# Patient Record
Sex: Male | Born: 1990
Health system: Southern US, Community
[De-identification: ages and names within clinical notes are randomized; demographics above are authoritative.]

## PROBLEM LIST (undated history)

## (undated) DIAGNOSIS — B009 Herpesviral infection, unspecified: Secondary | ICD-10-CM

## (undated) HISTORY — DX: Herpesviral infection, unspecified: B00.9

## (undated) HISTORY — PX: OTHER SURGICAL HISTORY: SHX169

---

## 2010-10-15 ENCOUNTER — Emergency Department: Payer: Self-pay | Admitting: Emergency Medicine

## 2015-03-11 ENCOUNTER — Ambulatory Visit (INDEPENDENT_AMBULATORY_CARE_PROVIDER_SITE_OTHER): Payer: Managed Care, Other (non HMO) | Admitting: Family Medicine

## 2015-03-11 ENCOUNTER — Encounter: Payer: Self-pay | Admitting: Family Medicine

## 2015-03-11 VITALS — BP 93/52 | HR 54 | Temp 98.6°F | Ht 66.3 in | Wt 168.0 lb

## 2015-03-11 DIAGNOSIS — Z23 Encounter for immunization: Secondary | ICD-10-CM

## 2015-03-11 DIAGNOSIS — Z0000 Encounter for general adult medical examination without abnormal findings: Secondary | ICD-10-CM | POA: Diagnosis not present

## 2015-03-11 LAB — URINALYSIS, ROUTINE W REFLEX MICROSCOPIC
Bilirubin, UA: NEGATIVE
Glucose, UA: NEGATIVE
KETONES UA: NEGATIVE
LEUKOCYTES UA: NEGATIVE
NITRITE UA: NEGATIVE
PH UA: 6.5 (ref 5.0–7.5)
PROTEIN UA: NEGATIVE
RBC, UA: NEGATIVE
Specific Gravity, UA: 1.015 (ref 1.005–1.030)
Urobilinogen, Ur: 0.2 mg/dL (ref 0.2–1.0)

## 2015-03-11 NOTE — Progress Notes (Signed)
   BP 93/52 mmHg  Pulse 54  Temp(Src) 98.6 F (37 C)  Ht 5' 6.3" (1.684 m)  Wt 168 lb (76.204 kg)  BMI 26.87 kg/m2  SpO2 99%   Subjective:    Patient ID: Sean Black, male    DOB: Nov 27, 1990, 24 y.o.   MRN: [id number]  HPI: Sean Pollenlienai Black is a 24 y.o. male  Chief Complaint  Patient presents with  . Annual Exam  . Hernia    possible, lifts weights    Relevant past medical, surgical, family and social history reviewed and updated as indicated. Interim medical history since our last visit reviewed. Allergies and medications reviewed and updated.  Review of Systems  Constitutional: Negative.   HENT: Negative.   Eyes: Negative.   Respiratory: Negative.   Cardiovascular: Negative.   Gastrointestinal: Negative.   Endocrine: Negative.   Genitourinary: Negative.   Musculoskeletal: Negative.   Skin: Negative.   Allergic/Immunologic: Negative.   Neurological: Negative.   Hematological: Negative.   Psychiatric/Behavioral: Negative.     Per HPI unless specifically indicated above     Objective:    BP 93/52 mmHg  Pulse 54  Temp(Src) 98.6 F (37 C)  Ht 5' 6.3" (1.684 m)  Wt 168 lb (76.204 kg)  BMI 26.87 kg/m2  SpO2 99%  Wt Readings from Last 3 Encounters:  03/11/15 168 lb (76.204 kg)  03/08/15 169 lb (76.658 kg)    Physical Exam  Constitutional: He is oriented to person, place, and time. He appears well-developed and well-nourished.  HENT:  Head: Normocephalic.  Right Ear: External ear normal.  Left Ear: External ear normal.  Nose: Nose normal.  Eyes: Conjunctivae and EOM are normal. Pupils are equal, round, and reactive to light.  Neck: Normal range of motion. Neck supple. No thyromegaly present.  Cardiovascular: Normal rate, regular rhythm, normal heart sounds and intact distal pulses.   Pulmonary/Chest: Effort normal and breath sounds normal.  Abdominal: Soft. Bowel sounds are normal. There is no splenomegaly or hepatomegaly.  Genitourinary: Penis normal.   Musculoskeletal: Normal range of motion.  Lymphadenopathy:    He has no cervical adenopathy.  Neurological: He is alert and oriented to person, place, and time. He has normal reflexes.  Skin: Skin is warm and dry.  Psychiatric: He has a normal mood and affect. His behavior is normal. Judgment and thought content normal.    No results found for this or any previous visit.    Assessment & Plan:   Problem List Items Addressed This Visit    None    Visit Diagnoses    PE (physical exam), annual    -  Primary    Relevant Orders    Comprehensive metabolic panel    Lipid panel    CBC with Differential/Platelet    TSH    Urinalysis, Routine w reflex microscopic (not at [facility])    Immunization due        Relevant Orders    Flu Vaccine QUAD 36+ mos PF IM (Fluarix & Fluzone Quad PF) (Completed)    Tdap vaccine greater than or equal to 7yo IM (Completed)        Follow up plan: Return if symptoms worsen or fail to improve, for Physical Exam.

## 2015-03-12 ENCOUNTER — Encounter: Payer: Self-pay | Admitting: Family Medicine

## 2015-03-12 LAB — COMPREHENSIVE METABOLIC PANEL
ALBUMIN: 4.5 g/dL (ref 3.5–5.5)
ALK PHOS: 80 IU/L (ref 39–117)
ALT: 18 IU/L (ref 0–44)
AST: 22 IU/L (ref 0–40)
Albumin/Globulin Ratio: 1.9 (ref 1.1–2.5)
BILIRUBIN TOTAL: 0.3 mg/dL (ref 0.0–1.2)
BUN / CREAT RATIO: 16 (ref 8–19)
BUN: 17 mg/dL (ref 6–20)
CHLORIDE: 99 mmol/L (ref 97–106)
CO2: 25 mmol/L (ref 18–29)
Calcium: 9.3 mg/dL (ref 8.7–10.2)
Creatinine, Ser: 1.04 mg/dL (ref 0.76–1.27)
GFR calc Af Amer: 115 mL/min/{1.73_m2} (ref >59)
GFR calc non Af Amer: 99 mL/min/{1.73_m2} (ref >59)
GLUCOSE: 88 mg/dL (ref 65–99)
Globulin, Total: 2.4 g/dL (ref 1.5–4.5)
Potassium: 4.4 mmol/L (ref 3.5–5.2)
Sodium: 139 mmol/L (ref 136–144)
Total Protein: 6.9 g/dL (ref 6.0–8.5)

## 2015-03-12 LAB — CBC WITH DIFFERENTIAL/PLATELET
BASOS ABS: 0 10*3/uL (ref 0.0–0.2)
Basos: 0 %
EOS (ABSOLUTE): 0.1 10*3/uL (ref 0.0–0.4)
EOS: 2 %
Hematocrit: 44.6 % (ref 37.5–51.0)
Hemoglobin: 15.1 g/dL (ref 12.6–17.7)
IMMATURE GRANULOCYTES: 0 %
Immature Grans (Abs): 0 10*3/uL (ref 0.0–0.1)
Lymphocytes Absolute: 2.1 10*3/uL (ref 0.7–3.1)
Lymphs: 31 %
MCH: 30.5 pg (ref 26.6–33.0)
MCHC: 33.9 g/dL (ref 31.5–35.7)
MCV: 90 fL (ref 79–97)
MONOS ABS: 0.5 10*3/uL (ref 0.1–0.9)
Monocytes: 7 %
NEUTROS PCT: 60 %
Neutrophils Absolute: 4.1 10*3/uL (ref 1.4–7.0)
PLATELETS: 200 10*3/uL (ref 150–379)
RBC: 4.95 x10E6/uL (ref 4.14–5.80)
RDW: 13.7 % (ref 12.3–15.4)
WBC: 6.9 10*3/uL (ref 3.4–10.8)

## 2015-03-12 LAB — LIPID PANEL
CHOL/HDL RATIO: 4.2 ratio (ref 0.0–5.0)
CHOLESTEROL TOTAL: 194 mg/dL (ref 100–199)
HDL: 46 mg/dL (ref >39)
LDL CALC: 109 mg/dL — AB (ref 0–99)
TRIGLYCERIDES: 196 mg/dL — AB (ref 0–149)
VLDL CHOLESTEROL CAL: 39 mg/dL (ref 5–40)

## 2015-03-12 LAB — TSH: TSH: 2.63 u[IU]/mL (ref 0.450–4.500)

## 2015-08-27 ENCOUNTER — Ambulatory Visit (INDEPENDENT_AMBULATORY_CARE_PROVIDER_SITE_OTHER): Payer: Self-pay | Admitting: Family Medicine

## 2015-08-27 ENCOUNTER — Encounter: Payer: Self-pay | Admitting: Family Medicine

## 2015-08-27 VITALS — BP 107/57 | HR 66 | Temp 99.2°F | Ht 67.1 in | Wt 174.0 lb

## 2015-08-27 DIAGNOSIS — J019 Acute sinusitis, unspecified: Secondary | ICD-10-CM

## 2015-08-27 MED ORDER — AZITHROMYCIN 250 MG PO TABS: ORAL_TABLET | ORAL | Status: DC

## 2015-08-27 MED ORDER — BENZONATATE 100 MG PO CAPS: 100.0000 mg | ORAL_CAPSULE | Freq: Two times a day (BID) | ORAL | Status: DC | PRN

## 2015-08-27 NOTE — Progress Notes (Signed)
BP 107/57 mmHg  Pulse 66  Temp(Src) 99.2 F (37.3 C)  Ht 5' 7.1" (1.704 m)  Wt 174 lb (78.926 kg)  BMI 27.18 kg/m2  SpO2 99%   Subjective:    Patient ID: Sean Black, male    DOB: [birth year], 25 y.o.   MRN: [id number]  HPI: Sean Black is a 25 y.o. male  Chief Complaint  Patient presents with  . URI    cough is bad x 8 days  Patient with sinus drainage congestion and facial pressure is been ongoing for 8days  Relevant past medical, surgical, family and social history reviewed and updated as indicated. Interim medical history since our last visit reviewed. Allergies and medications reviewed and updated.  Review of Systems  Constitutional: Positive for fever, chills, diaphoresis and fatigue.  HENT: Positive for congestion, hearing loss, postnasal drip, rhinorrhea, sinus pressure, sneezing and sore throat.   Respiratory: Positive for cough. Negative for choking, chest tightness and shortness of breath.   Cardiovascular: Negative for chest pain, palpitations and leg swelling.    Per HPI unless specifically indicated above     Objective:    BP 107/57 mmHg  Pulse 66  Temp(Src) 99.2 F (37.3 C)  Ht 5' 7.1" (1.704 m)  Wt 174 lb (78.926 kg)  BMI 27.18 kg/m2  SpO2 99%  Wt Readings from Last 3 Encounters:  08/27/15 174 lb (78.926 kg)  03/11/15 168 lb (76.204 kg)  03/08/15 169 lb (76.658 kg)    Physical Exam  Constitutional: He is oriented to person, place, and time. He appears well-developed and well-nourished. No distress.  HENT:  Head: Normocephalic and atraumatic.  Right Ear: Hearing and external ear normal.  Left Ear: Hearing and external ear normal.  Nose: Nose normal.  Mouth/Throat: Oropharyngeal exudate present.  Eyes: Conjunctivae and lids are normal. Right eye exhibits no discharge. Left eye exhibits no discharge. No scleral icterus.  Neck: No thyromegaly present.  Cardiovascular: Normal rate, regular rhythm and normal heart sounds.   Pulmonary/Chest:  Effort normal and breath sounds normal. No respiratory distress. He has no wheezes. He has no rales.  Musculoskeletal: Normal range of motion.  Lymphadenopathy:    He has no cervical adenopathy.  Neurological: He is alert and oriented to person, place, and time.  Skin: Skin is intact. No rash noted.  Psychiatric: He has a normal mood and affect. His speech is normal and behavior is normal. Judgment and thought content normal. Cognition and memory are normal.    Results for orders placed or performed in visit on 03/11/15  Comprehensive metabolic panel  Result Value Ref Range   Glucose 88 65 - 99 mg/dL   BUN 17 6 - 20 mg/dL   Creatinine, Ser 9.14 0.76 - 1.27 mg/dL   GFR calc non Af Amer 99 >59 mL/min/1.73   GFR calc Af Amer 115 >59 mL/min/1.73   BUN/Creatinine Ratio 16 8 - 19   Sodium 139 136 - 144 mmol/L   Potassium 4.4 3.5 - 5.2 mmol/L   Chloride 99 97 - 106 mmol/L   CO2 25 18 - 29 mmol/L   Calcium 9.3 8.7 - 10.2 mg/dL   Total Protein 6.9 6.0 - 8.5 g/dL   Albumin 4.5 3.5 - 5.5 g/dL   Globulin, Total 2.4 1.5 - 4.5 g/dL   Albumin/Globulin Ratio 1.9 1.1 - 2.5   Bilirubin Total 0.3 0.0 - 1.2 mg/dL   Alkaline Phosphatase 80 39 - 117 IU/L   AST 22 0 - 40 IU/L  ALT 18 0 - 44 IU/L  Lipid panel  Result Value Ref Range   Cholesterol, Total 194 100 - 199 mg/dL   Triglycerides 161196 (H) 0 - 149 mg/dL   HDL 46 >09>39 mg/dL   VLDL Cholesterol Cal 39 5 - 40 mg/dL   LDL Calculated 604109 (H) 0 - 99 mg/dL   Chol/HDL Ratio 4.2 0.0 - 5.0 ratio units  CBC with Differential/Platelet  Result Value Ref Range   WBC 6.9 3.4 - 10.8 x10E3/uL   RBC 4.95 4.14 - 5.80 x10E6/uL   Hemoglobin 15.1 12.6 - 17.7 g/dL   Hematocrit 54.044.6 98.137.5 - 51.0 %   MCV 90 79 - 97 fL   MCH 30.5 26.6 - 33.0 pg   MCHC 33.9 31.5 - 35.7 g/dL   RDW 19.113.7 47.812.3 - 29.515.4 %   Platelets 200 150 - 379 x10E3/uL   Neutrophils 60 %   Lymphs 31 %   Monocytes 7 %   Eos 2 %   Basos 0 %   Neutrophils Absolute 4.1 1.4 - 7.0 x10E3/uL    Lymphocytes Absolute 2.1 0.7 - 3.1 x10E3/uL   Monocytes Absolute 0.5 0.1 - 0.9 x10E3/uL   EOS (ABSOLUTE) 0.1 0.0 - 0.4 x10E3/uL   Basophils Absolute 0.0 0.0 - 0.2 x10E3/uL   Immature Granulocytes 0 %   Immature Grans (Abs) 0.0 0.0 - 0.1 x10E3/uL  TSH  Result Value Ref Range   TSH 2.630 0.450 - 4.500 uIU/mL  Urinalysis, Routine w reflex microscopic (not at [facility])  Result Value Ref Range   Specific Gravity, UA 1.015 1.005 - 1.030   pH, UA 6.5 5.0 - 7.5   Color, UA Yellow Yellow   Appearance Ur Clear Clear   Leukocytes, UA Negative Negative   Protein, UA Negative Negative/Trace   Glucose, UA Negative Negative   Ketones, UA Negative Negative   RBC, UA Negative Negative   Bilirubin, UA Negative Negative   Urobilinogen, Ur 0.2 0.2 - 1.0 mg/dL   Nitrite, UA Negative Negative      Assessment & Plan:   Problem List Items Addressed This Visit    None    Visit Diagnoses    Acute sinusitis, recurrence not specified, unspecified location    -  Primary    Relevant Medications    azithromycin (ZITHROMAX) 250 MG tablet    benzonatate (TESSALON) 100 MG capsule        Follow up plan: Return for As scheduled.

## 2015-08-28 ENCOUNTER — Ambulatory Visit: Payer: Managed Care, Other (non HMO) | Admitting: Family Medicine

## 2015-11-21 ENCOUNTER — Emergency Department
Admission: EM | Admit: 2015-11-21 | Discharge: 2015-11-21 | Disposition: A | Discharge status: Home / Self Care | Payer: No Typology Code available for payment source | Attending: Emergency Medicine | Admitting: Emergency Medicine

## 2015-11-21 ENCOUNTER — Encounter: Payer: Self-pay | Admitting: Emergency Medicine

## 2015-11-21 ENCOUNTER — Emergency Department: Payer: No Typology Code available for payment source

## 2015-11-21 DIAGNOSIS — S60221A Contusion of right hand, initial encounter: Secondary | ICD-10-CM | POA: Diagnosis not present

## 2015-11-21 DIAGNOSIS — Y939 Activity, unspecified: Secondary | ICD-10-CM | POA: Diagnosis not present

## 2015-11-21 DIAGNOSIS — Y92410 Unspecified street and highway as the place of occurrence of the external cause: Secondary | ICD-10-CM | POA: Insufficient documentation

## 2015-11-21 DIAGNOSIS — Y999 Unspecified external cause status: Secondary | ICD-10-CM | POA: Diagnosis not present

## 2015-11-21 DIAGNOSIS — S82111A Displaced fracture of right tibial spine, initial encounter for closed fracture: Secondary | ICD-10-CM | POA: Insufficient documentation

## 2015-11-21 DIAGNOSIS — S42002A Fracture of unspecified part of left clavicle, initial encounter for closed fracture: Secondary | ICD-10-CM

## 2015-11-21 DIAGNOSIS — S42022A Displaced fracture of shaft of left clavicle, initial encounter for closed fracture: Secondary | ICD-10-CM | POA: Diagnosis not present

## 2015-11-21 DIAGNOSIS — S82201A Unspecified fracture of shaft of right tibia, initial encounter for closed fracture: Secondary | ICD-10-CM

## 2015-11-21 DIAGNOSIS — M25561 Pain in right knee: Secondary | ICD-10-CM | POA: Diagnosis present

## 2015-11-21 MED ORDER — IBUPROFEN 400 MG PO TABS
400.0000 mg | ORAL_TABLET | Freq: Once | ORAL | Status: AC
  Administered 2015-11-21: 400 mg via ORAL
  Filled 2015-11-21: qty 1

## 2015-11-21 MED ORDER — OXYCODONE-ACETAMINOPHEN 5-325 MG PO TABS
1.0000 | ORAL_TABLET | Freq: Once | ORAL | Status: AC
Start: 2015-11-21 — End: 2015-11-21
  Administered 2015-11-21: 1 via ORAL
  Filled 2015-11-21: qty 1

## 2015-11-21 MED ORDER — OXYCODONE-ACETAMINOPHEN 5-325 MG PO TABS: 1.0000 | ORAL_TABLET | Freq: Four times a day (QID) | ORAL | Status: DC | PRN

## 2015-11-21 MED ORDER — TETANUS-DIPHTHERIA TOXOIDS TD 5-2 LFU IM INJ: 0.5000 mL | INJECTION | Freq: Once | INTRAMUSCULAR | Status: DC
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
## 2015-11-21 NOTE — ED Notes (Signed)
Pt. Given work excuse and instructions to call orthopedic doctor in morning.
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
## 2016-04-14 ENCOUNTER — Encounter: Payer: Self-pay | Admitting: Family Medicine

## 2016-07-06 ENCOUNTER — Ambulatory Visit: Payer: Self-pay | Admitting: Family Medicine

## 2016-08-25 ENCOUNTER — Encounter: Payer: Self-pay | Admitting: Family Medicine

## 2016-08-25 ENCOUNTER — Ambulatory Visit (INDEPENDENT_AMBULATORY_CARE_PROVIDER_SITE_OTHER): Payer: 59 | Admitting: Family Medicine

## 2016-08-25 VITALS — BP 104/65 | HR 67 | Temp 98.4°F | Wt 172.0 lb

## 2016-08-25 DIAGNOSIS — J019 Acute sinusitis, unspecified: Secondary | ICD-10-CM

## 2016-08-25 MED ORDER — HYDROCOD POLST-CPM POLST ER 10-8 MG/5ML PO SUER: 2.5000 mL | Freq: Two times a day (BID) | ORAL | 0 refills | Status: DC | PRN

## 2016-08-25 MED ORDER — AMOXICILLIN-POT CLAVULANATE 875-125 MG PO TABS: 1.0000 | ORAL_TABLET | Freq: Two times a day (BID) | ORAL | 0 refills | Status: DC

## 2016-08-25 NOTE — Assessment & Plan Note (Signed)
Discussed sinusitis care and treatment Tylenol over-the-counter medications Mucinex use of cough syrup with cautions about driving minimizing side effects from Augmentin.

## 2016-08-25 NOTE — Progress Notes (Signed)
BP 104/65   Pulse 67   Temp 98.4 F (36.9 C) (Oral)   Wt 172 lb (78 kg)   SpO2 98%   BMI 27.76 kg/m    Subjective:    Patient ID: Sean Black, male    DOB: 02/17/1991, 26 y.o.   MRN: [id number]  HPI: Sean Black is a 26 y.o. male  Chief Complaint  Patient presents with  . Sore Throat    since last Tuesday.   . Cough  Patient also with fever and just feeling bad and some achiness is been ongoing for a week was originally starting to get better and then just 2 days ago got significantly worse a lot of 6 sinus pressure congestion drainage also productive cough.  Relevant past medical, surgical, family and social history reviewed and updated as indicated. Interim medical history since our last visit reviewed. Allergies and medications reviewed and updated.  Review of Systems  Constitutional: Positive for chills, diaphoresis, fatigue and fever.  HENT: Positive for congestion, nosebleeds, postnasal drip, rhinorrhea, sinus pain, sinus pressure, sneezing and sore throat.   Respiratory: Positive for cough.   Cardiovascular: Negative.     Per HPI unless specifically indicated above     Objective:    BP 104/65   Pulse 67   Temp 98.4 F (36.9 C) (Oral)   Wt 172 lb (78 kg)   SpO2 98%   BMI 27.76 kg/m   Wt Readings from Last 3 Encounters:  08/25/16 172 lb (78 kg)  11/21/15 170 lb (77.1 kg)  08/27/15 174 lb (78.9 kg)    Physical Exam  Constitutional: He is oriented to person, place, and time. He appears well-developed and well-nourished.  HENT:  Head: Normocephalic and atraumatic.  Right Ear: External ear normal.  Left Ear: External ear normal.  Mouth/Throat: Oropharyngeal exudate present.  Eyes: Conjunctivae and EOM are normal.  Neck: Normal range of motion.  Cardiovascular: Normal rate, regular rhythm and normal heart sounds.   Pulmonary/Chest: Effort normal and breath sounds normal.  Musculoskeletal: Normal range of motion.    Lymphadenopathy:    He has no cervical adenopathy.  Neurological: He is alert and oriented to person, place, and time.  Skin: No erythema.  Psychiatric: He has a normal mood and affect. His behavior is normal. Judgment and thought content normal.    Results for orders placed or performed in visit on 03/11/15  Comprehensive metabolic panel  Result Value Ref Range   Glucose 88 65 - 99 mg/dL   BUN 17 6 - 20 mg/dL   Creatinine, Ser 4.09 0.76 - 1.27 mg/dL   GFR calc non Af Amer 99 >59 mL/min/1.73   GFR calc Af Amer 115 >59 mL/min/1.73   BUN/Creatinine Ratio 16 8 - 19   Sodium 139 136 - 144 mmol/L   Potassium 4.4 3.5 - 5.2 mmol/L   Chloride 99 97 - 106 mmol/L   CO2 25 18 - 29 mmol/L   Calcium 9.3 8.7 - 10.2 mg/dL   Total Protein 6.9 6.0 - 8.5 g/dL   Albumin 4.5 3.5 - 5.5 g/dL   Globulin, Total 2.4 1.5 - 4.5 g/dL   Albumin/Globulin Ratio 1.9 1.1 - 2.5   Bilirubin Total 0.3 0.0 - 1.2 mg/dL   Alkaline Phosphatase 80 39 - 117 IU/L   AST 22 0 - 40 IU/L   ALT 18 0 - 44 IU/L  Lipid panel  Result Value Ref Range   Cholesterol, Total 194 100 - 199 mg/dL  Triglycerides 196 (H) 0 - 149 mg/dL   HDL 46 >16 mg/dL   VLDL Cholesterol Cal 39 5 - 40 mg/dL   LDL Calculated 109 (H) 0 - 99 mg/dL   Chol/HDL Ratio 4.2 0.0 - 5.0 ratio units  CBC with Differential/Platelet  Result Value Ref Range   WBC 6.9 3.4 - 10.8 x10E3/uL   RBC 4.95 4.14 - 5.80 x10E6/uL   Hemoglobin 15.1 12.6 - 17.7 g/dL   Hematocrit 60.4 54.0 - 51.0 %   MCV 90 79 - 97 fL   MCH 30.5 26.6 - 33.0 pg   MCHC 33.9 31.5 - 35.7 g/dL   RDW 98.1 19.1 - 47.8 %   Platelets 200 150 - 379 x10E3/uL   Neutrophils 60 %   Lymphs 31 %   Monocytes 7 %   Eos 2 %   Basos 0 %   Neutrophils Absolute 4.1 1.4 - 7.0 x10E3/uL   Lymphocytes Absolute 2.1 0.7 - 3.1 x10E3/uL   Monocytes Absolute 0.5 0.1 - 0.9 x10E3/uL   EOS (ABSOLUTE) 0.1 0.0 - 0.4 x10E3/uL   Basophils Absolute 0.0 0.0 - 0.2 x10E3/uL   Immature Granulocytes 0 %   Immature Grans  (Abs) 0.0 0.0 - 0.1 x10E3/uL  TSH  Result Value Ref Range   TSH 2.630 0.450 - 4.500 uIU/mL  Urinalysis, Routine w reflex microscopic (not at [facility])  Result Value Ref Range   Specific Gravity, UA 1.015 1.005 - 1.030   pH, UA 6.5 5.0 - 7.5   Color, UA Yellow Yellow   Appearance Ur Clear Clear   Leukocytes, UA Negative Negative   Protein, UA Negative Negative/Trace   Glucose, UA Negative Negative   Ketones, UA Negative Negative   RBC, UA Negative Negative   Bilirubin, UA Negative Negative   Urobilinogen, Ur 0.2 0.2 - 1.0 mg/dL   Nitrite, UA Negative Negative      Assessment & Plan:   Problem List Items Addressed This Visit      Respiratory   Acute sinusitis - Primary    Discussed sinusitis care and treatment Tylenol over-the-counter medications Mucinex use of cough syrup with cautions about driving minimizing side effects from Augmentin.      Relevant Medications   amoxicillin-clavulanate (AUGMENTIN) 875-125 MG tablet   chlorpheniramine-HYDROcodone (TUSSIONEX PENNKINETIC ER) 10-8 MG/5ML SUER       Follow up plan: Return for As scheduled.

## 2016-10-07 ENCOUNTER — Encounter: Payer: 59 | Admitting: Family Medicine
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
## 2018-06-30 LAB — HM HIV SCREENING LAB: HM HIV Screening: NEGATIVE

## 2018-11-21 ENCOUNTER — Ambulatory Visit: Payer: Self-pay

## 2019-06-23 ENCOUNTER — Encounter: Payer: 59 | Admitting: Nurse Practitioner

## 2019-07-04 ENCOUNTER — Ambulatory Visit: Payer: Self-pay

## 2019-07-12 ENCOUNTER — Encounter: Payer: Self-pay | Admitting: Nurse Practitioner

## 2019-07-12 ENCOUNTER — Other Ambulatory Visit: Payer: Self-pay

## 2019-07-12 ENCOUNTER — Ambulatory Visit (INDEPENDENT_AMBULATORY_CARE_PROVIDER_SITE_OTHER): Payer: 59 | Admitting: Nurse Practitioner

## 2019-07-12 VITALS — BP 94/57 | HR 59 | Temp 98.3°F | Ht 66.8 in | Wt 184.0 lb

## 2019-07-12 DIAGNOSIS — Z1329 Encounter for screening for other suspected endocrine disorder: Secondary | ICD-10-CM

## 2019-07-12 DIAGNOSIS — Z0000 Encounter for general adult medical examination without abnormal findings: Secondary | ICD-10-CM

## 2019-07-12 DIAGNOSIS — Z13220 Encounter for screening for lipoid disorders: Secondary | ICD-10-CM

## 2019-07-12 NOTE — Patient Instructions (Signed)
 Health Maintenance, Male Adopting a healthy lifestyle and getting preventive care are important in promoting health and wellness. Ask your health care provider about:  The right schedule for you to have regular tests and exams.  Things you can do on your own to prevent diseases and keep yourself healthy. What should I know about diet, weight, and exercise? Eat a healthy diet   Eat a diet that includes plenty of vegetables, fruits, low-fat dairy products, and lean protein.  Do not eat a lot of foods that are high in solid fats, added sugars, or sodium. Maintain a healthy weight Body mass index (BMI) is a measurement that can be used to identify possible weight problems. It estimates body fat based on height and weight. Your health care provider can help determine your BMI and help you achieve or maintain a healthy weight. Get regular exercise Get regular exercise. This is one of the most important things you can do for your health. Most adults should:  Exercise for at least 150 minutes each week. The exercise should increase your heart rate and make you sweat (moderate-intensity exercise).  Do strengthening exercises at least twice a week. This is in addition to the moderate-intensity exercise.  Spend less time sitting. Even light physical activity can be beneficial. Watch cholesterol and blood lipids Have your blood tested for lipids and cholesterol at 30 years of age, then have this test every 5 years. You may need to have your cholesterol levels checked more often if:  Your lipid or cholesterol levels are high.  You are older than 30 years of age.  You are at high risk for heart disease. What should I know about cancer screening? Many types of cancers can be detected early and may often be prevented. Depending on your health history and family history, you may need to have cancer screening at various ages. This may include screening for:  Colorectal cancer.  Prostate  cancer.  Skin cancer.  Lung cancer. What should I know about heart disease, diabetes, and high blood pressure? Blood pressure and heart disease  High blood pressure causes heart disease and increases the risk of stroke. This is more likely to develop in people who have high blood pressure readings, are of African descent, or are overweight.  Talk with your health care provider about your target blood pressure readings.  Have your blood pressure checked: ? Every 3-5 years if you are 18-39 years of age. ? Every year if you are 40 years old or older.  If you are between the ages of 65 and 75 and are a current or former smoker, ask your health care provider if you should have a one-time screening for abdominal aortic aneurysm (AAA). Diabetes Have regular diabetes screenings. This checks your fasting blood sugar level. Have the screening done:  Once every three years after age 45 if you are at a normal weight and have a low risk for diabetes.  More often and at a younger age if you are overweight or have a high risk for diabetes. What should I know about preventing infection? Hepatitis B If you have a higher risk for hepatitis B, you should be screened for this virus. Talk with your health care provider to find out if you are at risk for hepatitis B infection. Hepatitis C Blood testing is recommended for:  Everyone born from 1945 through 1965.  Anyone with known risk factors for hepatitis C. Sexually transmitted infections (STIs)  You should be screened each   year for STIs, including gonorrhea and chlamydia, if: ? You are sexually active and are younger than 30 years of age. ? You are older than 30 years of age and your health care provider tells you that you are at risk for this type of infection. ? Your sexual activity has changed since you were last screened, and you are at increased risk for chlamydia or gonorrhea. Ask your health care provider if you are at risk.  Ask your  health care provider about whether you are at high risk for HIV. Your health care provider may recommend a prescription medicine to help prevent HIV infection. If you choose to take medicine to prevent HIV, you should first get tested for HIV. You should then be tested every 3 months for as long as you are taking the medicine. Follow these instructions at home: Lifestyle  Do not use any products that contain nicotine or tobacco, such as cigarettes, e-cigarettes, and chewing tobacco. If you need help quitting, ask your health care provider.  Do not use street drugs.  Do not share needles.  Ask your health care provider for help if you need support or information about quitting drugs. Alcohol use  Do not drink alcohol if your health care provider tells you not to drink.  If you drink alcohol: ? Limit how much you have to 0-2 drinks a day. ? Be aware of how much alcohol is in your drink. In the [place] one drink equals one 12 oz bottle of beer (355 mL), one 5 oz glass of wine (148 mL), or one 1 oz glass of hard liquor (44 mL). General instructions  Schedule regular health, dental, and eye exams.  Stay current with your vaccines.  Tell your health care provider if: ? You often feel depressed. ? You have ever been abused or do not feel safe at home. Summary  Adopting a healthy lifestyle and getting preventive care are important in promoting health and wellness.  Follow your health care provider's instructions about healthy diet, exercising, and getting tested or screened for diseases.  Follow your health care provider's instructions on monitoring your cholesterol and blood pressure. This information is not intended to replace advice given to you by your health care provider. Make sure you discuss any questions you have with your health care provider. Document Revised: 04/13/2018 Document Reviewed: 04/13/2018 Elsevier Patient Education  2020 [employer]  American Heart Association  (AHA) Exercise Recommendation  Being physically active is important to prevent heart disease and stroke, the nation's No. 1and No. 5killers. To improve overall cardiovascular health, we suggest at least 150 minutes per week of moderate exercise or 75 minutes per week of vigorous exercise (or a combination of moderate and vigorous activity). Thirty minutes a day, five times a week is an easy goal to remember. You will also experience benefits even if you divide your time into two or three segments of 10 to 15 minutes per day.  For people who would benefit from lowering their blood pressure or cholesterol, we recommend 40 minutes of aerobic exercise of moderate to vigorous intensity three to four times a week to lower the risk for heart attack and stroke.  Physical activity is anything that makes you move your body and burn calories.  This includes things like climbing stairs or playing sports. Aerobic exercises benefit your heart, and include walking, jogging, swimming or biking. Strength and stretching exercises are best for overall stamina and flexibility.  The simplest, positive change you can   make to effectively improve your heart health is to start walking. It's enjoyable, free, easy, social and great exercise. A walking program is flexible and boasts high success rates because people can stick with it. It's easy for walking to become a regular and satisfying part of life.   For Overall Cardiovascular Health:  At least 30 minutes of moderate-intensity aerobic activity at least 5 days per week for a total of 150  OR   At least 25 minutes of vigorous aerobic activity at least 3 days per week for a total of 75 minutes; or a combination of moderate- and vigorous-intensity aerobic activity  AND   Moderate- to high-intensity muscle-strengthening activity at least 2 days per week for additional health benefits.  For Lowering Blood Pressure and Cholesterol  An average 40 minutes of moderate-  to vigorous-intensity aerobic activity 3 or 4 times per week  What if I can't make it to the time goal? Something is always better than nothing! And everyone has to start somewhere. Even if you've been sedentary for years, today is the day you can begin to make healthy changes in your life. If you don't think you'll make it for 30 or 40 minutes, set a reachable goal for today. You can work up toward your overall goal by increasing your time as you get stronger. Don't let all-or-nothing thinking rob you of doing what you can every day.  Source:http://www.heart.org    

## 2019-07-12 NOTE — Progress Notes (Signed)
BP (!) 94/57   Pulse (!) 59   Temp 98.3 F (36.8 C) (Oral)   Ht 5' 6.8" (1.697 m)   Wt 184 lb (83.5 kg)   SpO2 99%   BMI 28.99 kg/m    Subjective:    Patient ID: Sean Black, male    DOB: Aug 03, 1990, 29 y.o.   MRN: [id number]  HPI: Sean Black is a 29 y.o. male presenting on 07/12/2019 for comprehensive medical examination. Current medical complaints include:none  He currently lives with: self Interim Problems from his last visit: no   Overall reports he is doing well, being followed at [facility] at this time for some low testosterone they noted on their labs there.  Denies acute issues today.  Functional Status Survey: Is the patient deaf or have difficulty hearing?: No Does the patient have difficulty seeing, even when wearing glasses/contacts?: No Does the patient have difficulty concentrating, remembering, or making decisions?: No Does the patient have difficulty walking or climbing stairs?: No Does the patient have difficulty dressing or bathing?: No Does the patient have difficulty doing errands alone such as visiting a doctor's office or shopping?: No  FALL RISK: Fall Risk  07/12/2019 08/25/2016  Falls in the past year? 0 No  Number falls in past yr: 0 -  Injury with Fall? 0 -  Follow up Falls evaluation completed -    Depression Screen Depression screen [facility] 2/9 07/12/2019 08/25/2016  Decreased Interest 0 0  Down, Depressed, Hopeless 0 0  PHQ - 2 Score 0 0    Advanced Directives <no information>  Past Medical History:  Past Medical History:  Diagnosis Date  . HSV-2 (herpes simplex virus 2) infection     Surgical History:  Past Surgical History:  Procedure Laterality Date  . wisdom tooth  removal Bilateral     Medications:  No current outpatient medications on file prior to visit.   No current facility-administered medications on file prior to visit.    Allergies:  No Known Allergies  Social History:  Social History    Socioeconomic History  . Marital status: Single    Spouse name: Not on file  . Number of children: Not on file  . Years of education: Not on file  . Highest education level: Not on file  Occupational History  . Not on file  Tobacco Use  . Smoking status: Never Smoker  . Smokeless tobacco: Never Used  Substance and Sexual Activity  . Alcohol use: Yes    Comment: occasional  . Drug use: No  . Sexual activity: Not on file  Other Topics Concern  . Not on file  Social History Narrative  . Not on file   Social Determinants of Health   Financial Resource Strain:   . Difficulty of Paying Living Expenses: Not on file  Food Insecurity:   . Worried About Charity fundraiser in the Last Year: Not on file  . Ran Out of Food in the Last Year: Not on file  Transportation Needs:   . Lack of Transportation (Medical): Not on file  . Lack of Transportation (Non-Medical): Not on file  Physical Activity:   . Days of Exercise per Week: Not on file  . Minutes of Exercise per Session: Not on file  Stress:   . Feeling of Stress : Not on file  Social Connections:   . Frequency of Communication with Friends and Family: Not on file  . Frequency of Social Gatherings with Friends and  Family: Not on file  . Attends Religious Services: Not on file  . Active Member of Clubs or Organizations: Not on file  . Attends Banker Meetings: Not on file  . Marital Status: Not on file  Intimate Partner Violence:   . Fear of Current or Ex-Partner: Not on file  . Emotionally Abused: Not on file  . Physically Abused: Not on file  . Sexually Abused: Not on file   Social History   Tobacco Use  Smoking Status Never Smoker  Smokeless Tobacco Never Used   Social History   Substance and Sexual Activity  Alcohol Use Yes   Comment: occasional    Family History:  Family History  Problem Relation Age of Onset  . Diabetes Father   . Diabetes Maternal Grandmother     Past medical history,  surgical history, medications, allergies, family history and social history reviewed with patient today and changes made to appropriate areas of the chart.   Review of Systems - negative All other ROS negative except what is listed above and in the HPI.      Objective:    BP (!) 94/57   Pulse (!) 59   Temp 98.3 F (36.8 C) (Oral)   Ht 5' 6.8" (1.697 m)   Wt 184 lb (83.5 kg)   SpO2 99%   BMI 28.99 kg/m   Wt Readings from Last 3 Encounters:  07/12/19 184 lb (83.5 kg)  08/25/16 172 lb (78 kg)  11/21/15 170 lb (77.1 kg)    Physical Exam Vitals and nursing note reviewed.  Constitutional:      General: He is awake. He is not in acute distress.    Appearance: He is well-developed and well-groomed. He is not ill-appearing.  HENT:     Head: Normocephalic and atraumatic.     Right Ear: Hearing, tympanic membrane, ear canal and external ear normal. No drainage.     Left Ear: Hearing, tympanic membrane, ear canal and external ear normal. No drainage.     Nose: Nose normal.     Mouth/Throat:     Pharynx: Uvula midline.  Eyes:     General: Lids are normal.        Right eye: No discharge.        Left eye: No discharge.     Extraocular Movements: Extraocular movements intact.     Conjunctiva/sclera: Conjunctivae normal.     Pupils: Pupils are equal, round, and reactive to light.     Visual Fields: Right eye visual fields normal and left eye visual fields normal.  Neck:     Thyroid: No thyromegaly.     Vascular: No carotid bruit or JVD.     Trachea: Trachea normal.  Cardiovascular:     Rate and Rhythm: Normal rate and regular rhythm.     Heart sounds: Normal heart sounds, S1 normal and S2 normal. No murmur. No gallop.   Pulmonary:     Effort: Pulmonary effort is normal. No accessory muscle usage or respiratory distress.     Breath sounds: Normal breath sounds.  Abdominal:     General: Bowel sounds are normal.     Palpations: Abdomen is soft. There is no hepatomegaly or  splenomegaly.     Tenderness: There is no abdominal tenderness.  Musculoskeletal:        General: Normal range of motion.     Cervical back: Normal range of motion and neck supple.     Right lower leg: No edema.  Left lower leg: No edema.  Lymphadenopathy:     Head:     Right side of head: No submental, submandibular, tonsillar, preauricular or posterior auricular adenopathy.     Left side of head: No submental, submandibular, tonsillar, preauricular or posterior auricular adenopathy.     Cervical: No cervical adenopathy.  Skin:    General: Skin is warm and dry.     Capillary Refill: Capillary refill takes less than 2 seconds.     Findings: No rash.  Neurological:     Mental Status: He is alert and oriented to person, place, and time.     Cranial Nerves: Cranial nerves are intact.     Gait: Gait is intact.     Deep Tendon Reflexes: Reflexes are normal and symmetric.     Reflex Scores:      Brachioradialis reflexes are 2+ on the right side and 2+ on the left side.      Patellar reflexes are 2+ on the right side and 2+ on the left side. Psychiatric:        Attention and Perception: Attention normal.        Mood and Affect: Mood normal.        Speech: Speech normal.        Behavior: Behavior normal. Behavior is cooperative.        Thought Content: Thought content normal.        Cognition and Memory: Cognition normal.        Judgment: Judgment normal.    Results for orders placed or performed in visit on 11/17/18  [facility] HIV SCREENING LAB  Result Value Ref Range   [facility] HIV Screening Negative - Validated       Assessment & Plan:   Problem List Items Addressed This Visit    None    Visit Diagnoses    Annual physical exam    -  Primary   Obtain annual labs today to include CBC, CMP, TSH, lipid   Relevant Orders   CBC with Differential/Platelet   Comprehensive metabolic panel   Lipid Panel w/o Chol/HDL Ratio   TSH   Screening cholesterol level       Lipid screen ordered    Relevant Orders   Comprehensive metabolic panel   Thyroid disorder screen       TSH ordered   Relevant Orders   TSH      Discussed aspirin prophylaxis for myocardial infarction prevention and decision was it was not indicated  LABORATORY TESTING:  Health maintenance labs ordered today as discussed above.   IMMUNIZATIONS:   - Tdap: Tetanus vaccination status reviewed: last tetanus booster within 10 years. - Influenza: Refused - Pneumovax: Not applicable - Prevnar: Not applicable - Zostavax vaccine: Not applicable  SCREENING: - Colonoscopy: Not applicable  Discussed with patient purpose of the colonoscopy is to detect colon cancer at curable precancerous or early stages   - AAA Screening: Not applicable  -Hearing Test: Not applicable  -Spirometry: Not applicable   PATIENT COUNSELING:    Sexuality: Discussed sexually transmitted diseases, partner selection, use of condoms, avoidance of unintended pregnancy  and contraceptive alternatives.   Advised to avoid cigarette smoking.  I discussed with the patient that most people either abstain from alcohol or drink within safe limits (<=14/week and <=4 drinks/occasion for males, <=7/weeks and <= 3 drinks/occasion for females) and that the risk for alcohol disorders and other health effects rises proportionally with the number of drinks per week and how often a  drinker exceeds daily limits.  Discussed cessation/primary prevention of drug use and availability of treatment for abuse.   Diet: Encouraged to adjust caloric intake to maintain  or achieve ideal body weight, to reduce intake of dietary saturated fat and total fat, to limit sodium intake by avoiding high sodium foods and not adding table salt, and to maintain adequate dietary potassium and calcium preferably from fresh fruits, vegetables, and low-fat dairy products.    stressed the importance of regular exercise  Injury prevention: Discussed safety belts, safety helmets, smoke  detector, smoking near bedding or upholstery.   Dental health: Discussed importance of regular tooth brushing, flossing, and dental visits.   Follow up plan: NEXT PREVENTATIVE PHYSICAL DUE IN 1 YEAR. Return in about 1 year (around 07/11/2020) for Annual physical.

## 2019-07-13 LAB — CBC WITH DIFFERENTIAL/PLATELET
Basophils Absolute: 0 10*3/uL (ref 0.0–0.2)
Basos: 1 %
EOS (ABSOLUTE): 0.2 10*3/uL (ref 0.0–0.4)
Eos: 4 %
Hematocrit: 49 % (ref 37.5–51.0)
Hemoglobin: 16 g/dL (ref 13.0–17.7)
Immature Grans (Abs): 0 10*3/uL (ref 0.0–0.1)
Immature Granulocytes: 0 %
Lymphocytes Absolute: 1.8 10*3/uL (ref 0.7–3.1)
Lymphs: 42 %
MCH: 30.5 pg (ref 26.6–33.0)
MCHC: 32.7 g/dL (ref 31.5–35.7)
MCV: 94 fL (ref 79–97)
Monocytes Absolute: 0.4 10*3/uL (ref 0.1–0.9)
Monocytes: 11 %
Neutrophils Absolute: 1.8 10*3/uL (ref 1.4–7.0)
Neutrophils: 42 %
Platelets: 237 10*3/uL (ref 150–450)
RBC: 5.24 x10E6/uL (ref 4.14–5.80)
RDW: 12.6 % (ref 11.6–15.4)
WBC: 4.2 10*3/uL (ref 3.4–10.8)

## 2019-07-13 LAB — COMPREHENSIVE METABOLIC PANEL
ALT: 58 IU/L — ABNORMAL HIGH (ref 0–44)
AST: 54 IU/L — ABNORMAL HIGH (ref 0–40)
Albumin/Globulin Ratio: 1.8 (ref 1.2–2.2)
Albumin: 4.9 g/dL (ref 4.1–5.2)
Alkaline Phosphatase: 69 IU/L (ref 39–117)
BUN/Creatinine Ratio: 15 (ref 9–20)
BUN: 14 mg/dL (ref 6–20)
Bilirubin Total: 0.4 mg/dL (ref 0.0–1.2)
CO2: 23 mmol/L (ref 20–29)
Calcium: 9.7 mg/dL (ref 8.7–10.2)
Chloride: 101 mmol/L (ref 96–106)
Creatinine, Ser: 0.96 mg/dL (ref 0.76–1.27)
GFR calc Af Amer: 123 mL/min/{1.73_m2} (ref >59)
GFR calc non Af Amer: 106 mL/min/{1.73_m2} (ref >59)
Globulin, Total: 2.7 g/dL (ref 1.5–4.5)
Glucose: 50 mg/dL — ABNORMAL LOW (ref 65–99)
Potassium: 4.4 mmol/L (ref 3.5–5.2)
Sodium: 140 mmol/L (ref 134–144)
Total Protein: 7.6 g/dL (ref 6.0–8.5)

## 2019-07-13 LAB — LIPID PANEL W/O CHOL/HDL RATIO
Cholesterol, Total: 194 mg/dL (ref 100–199)
HDL: 28 mg/dL — ABNORMAL LOW (ref >39)
LDL Chol Calc (NIH): 148 mg/dL — ABNORMAL HIGH (ref 0–99)
Triglycerides: 97 mg/dL (ref 0–149)
VLDL Cholesterol Cal: 18 mg/dL (ref 5–40)

## 2019-07-13 LAB — TSH: TSH: 2.94 u[IU]/mL (ref 0.450–4.500)

## 2019-07-13 NOTE — Progress Notes (Signed)
Good morning.  Please let Sean Black know his labs have returned.   - CBC shows no infection or anemia - Kidney function is normal, glucose was a little low, make sure you are taking in good amount of meals during daytime hours. - Liver function tests were mildly elevated -- if you drink alcohol or take Tylenol I do recommend cutting on on these and we will recheck next visit. - Cholesterol -- LDL (bad cholesterol) was a little elevated.  No medications needed, but focus on diet with low cholesterol choices and lots of fruits and vegetables. - thyroid testing is normal. Have a great day and let me know if any questions.

## 2020-01-11 ENCOUNTER — Ambulatory Visit: Payer: 59 | Admitting: Unknown Physician Specialty

## 2020-02-13 ENCOUNTER — Ambulatory Visit (INDEPENDENT_AMBULATORY_CARE_PROVIDER_SITE_OTHER): Payer: 59 | Admitting: Nurse Practitioner

## 2020-02-13 ENCOUNTER — Other Ambulatory Visit: Payer: Self-pay

## 2020-02-13 VITALS — BP 107/65 | HR 79 | Temp 98.7°F | Wt 196.0 lb

## 2020-02-13 DIAGNOSIS — N4889 Other specified disorders of penis: Secondary | ICD-10-CM | POA: Insufficient documentation

## 2020-02-13 DIAGNOSIS — R6882 Decreased libido: Secondary | ICD-10-CM | POA: Diagnosis not present

## 2020-02-13 NOTE — Patient Instructions (Addendum)
Sean Black,  Try cleaning under your foreskin with water only.  You can pick up some Cera ve cream over the counter moisturizer and apply that after cleaning with water to help with the red and inflamed areas.  Please let us know if your symptoms continue to persist > 2 weeks.  Balanitis  Balanitis is swelling and irritation (inflammation) of the head of the penis (glans penis). The condition may also cause inflammation of the skin around the glans penis (foreskin) in men who have not been circumcised. It may develop because of an infection or another medical condition. Balanitis occurs most often among men who have not had their foreskin removed (uncircumcised men). Balanitis sometimes causes scarring of the penis or foreskin, which can require surgery. Untreated balanitis can increase the risk of penile cancer. What are the causes? Common causes of this condition include:  Poor personal hygiene, especially in uncircumcised men. Not cleaning the glans penis and foreskin well can result in buildup of bacteria, viruses, and yeast, which can lead to infection and inflammation.  Irritation and lack of air flow due to fluid (smegma) that can build up on the glans penis. Other causes include:  Chemical irritation from products such as soaps or shower gels (especially those that have fragrance), condoms, personal lubricants, petroleum jelly, spermicides, or fabric softeners.  Skin conditions, such as eczema, dermatitis, and psoriasis.  Allergies to medicines, such as tetracycline and sulfa drugs.  Certain medical conditions, including liver cirrhosis, congestive heart failure, diabetes, and kidney disease.  Infections, such as candidiasis, HPV (human papillomavirus), herpes simplex, gonorrhea, and syphilis.  Severe obesity. What increases the risk? The following factors may make you more likely to develop this condition:  Having diabetes. This is the most common risk factor.  Having a tight  foreskin that is difficult to pull back (retract) past the glans.  Having sexual intercourse without using a condom. What are the signs or symptoms? Symptoms of this condition include:  Discharge from under the foreskin.  A bad smell.  Pain or difficulty retracting the foreskin.  Tenderness, redness, and swelling of the glans.  A rash or sores on the glans or foreskin.  Itchiness.  Inability to get an erection due to pain.  Difficulty urinating.  Scarring of the penis or foreskin, in some cases. How is this diagnosed? This condition may be diagnosed based on:  A physical exam.  Testing a swab of discharge to check for bacterial or fungal infection.  Blood tests: ? To check for viruses that can cause balanitis. ? To check your blood sugar (glucose) level. High blood glucose could be a sign of diabetes, which can cause balanitis. How is this treated? Treatment for balanitis depends on the cause. Treatment may include:  Improving personal hygiene. Your health care provider may recommend sitting in a bath of warm water that is deep enough to cover your hips and buttocks (sitz bath).  Medicines such as: ? Creams or ointments to reduce swelling (steroids) or to treat an infection. ? Antibiotic medicine. ? Antifungal medicine.  Surgery to remove or cut the foreskin (circumcision). This may be done if you have scarring on the foreskin that makes it difficult to retract.  Controlling other medical problems that may be causing your condition or making it worse. Follow these instructions at home:  Do not have sex until the condition clears up, or until your health care provider approves.  Keep your penis clean and dry. Take sitz baths as recommended by your  health care provider.  Avoid products that irritate your skin or make symptoms worse, such as soaps and shower gels that have fragrance.  Take over-the-counter and prescription medicines only as told by your health care  provider. ? If you were prescribed an antibiotic medicine or a cream or ointment, use it as told by your health care provider. Do not stop using your medicine, cream, or ointment even if you start to feel better. ? Do not drive or use heavy machinery while taking prescription pain medicine. Contact a health care provider if:  Your symptoms get worse or do not improve with home care.  You develop chills or a fever.  You have trouble urinating.  You cannot retract your foreskin. Get help right away if:  You develop severe pain.  You are unable to urinate. Summary  Balanitis is inflammation of the head of the penis (glans penis) caused by irritation or infection.  Balanitis causes pain, redness, and swelling of the glans penis.  This condition is most common among uncircumcised men who do not keep their glans penis clean and in men who have diabetes.  Treatment may include creams or ointments.  Good hygiene is important for prevention. This includes pulling back the foreskin when washing your penis. This information is not intended to replace advice given to you by your health care provider. Make sure you discuss any questions you have with your health care provider. Document Revised: 04/02/2017 Document Reviewed: 03/09/2016 Elsevier Patient Education  2020 [employer].

## 2020-02-13 NOTE — Assessment & Plan Note (Addendum)
Acute, ongoing.  With pinpoint erythematous areas noted to head of penis, likely dermatitis from harsh soaps when cleaning genitalia.  Also likely exacerbated with recent sexual activity.  Encouraged patient to continue cleaning with water only and apply moisturizer and let dry fully prior to replacing foreskin over head of penis.  Will obtain urinalysis, GC chlamydia testing, and HIV/syphilis testing today.  Encourage patient to let the area heal fully prior to sexual activity.  Encouraged using condoms with each sexual encounter.  If dermatitis persist, patient to return to clinic.

## 2020-02-13 NOTE — Assessment & Plan Note (Signed)
Acute, ongoing.  Unclear etiology if related to recent changes to skin of penis or if ongoing issue prior to this time.  Discussed potential for medication to help with mood versus therapy.  Agreeable to check testosterone-want measure of testosterone for see in the morning for accuracy.  Patient to return to clinic to check testosterone.

## 2020-02-13 NOTE — Progress Notes (Signed)
BP 107/65    Pulse 79    Temp 98.7 F (37.1 C) (Oral)    Wt 196 lb (88.9 kg)    SpO2 97%    BMI 30.88 kg/m    Subjective:    Patient ID: Sean Black, male    DOB: 1990/07/10, 29 y.o.   MRN: [id number]  HPI: Sean Black is a 29 y.o. male presenting with issues concerning his penis.  Chief Complaint  Patient presents with   Penis Pain    pt states he has been noticing some red spots around the tip of his penis. States they itch sometimes and are painful. States he went to the [facility] 2 months ago and was given clotrimazole cream for some white spots on his penis. Also states he has off and on testicular pain as well. Would like STD check as well.    PENIS PAIN Reports red spots around tip of penis that are itchy and sometimes painful.  Started as white spots and were painful.  Was treated for yeast with clotrimazole. Stopped using the cream when the spots turned red.  Has also had some testicular pain on and off.  Duration: months; red spots showed up about 10 days ago Onset: sudden Severity: mild to moderate Quality: burning Frequency: intermittent when cleaning/touching penis Episode duration: after touching it, goes away Alleviating factors: nothing tried Aggravating factors: nothing noticed Previous evaluation/studies: previous STD at [facility] was negative Treatments attempted: nothing Fevers: yes; hot and chills during the night Nausea/vomiting: no Penis discharge: no  Sexually active: yes  Dyspareunia: yes  Use of condoms: sometimes Weight loss: no Dysuria/urinary frequency: no Hematuria: no Sexual activity: yes; one male sexual partner History STDs: no  DECREASED LIBIDO Patient reports decreased libido over the past couple of weeks/months.  Reports feeling sad when he turned 29 and feels like he cannot exercise or keep up physically like he used to be able to.  Wondering if he can have his testosterone checked.  Reports he does not feel  down or depressed but has noticed a change in his mood and thoughts. Mood status: uncontrolled Satisfied with current treatment?: Not currently on treatment Symptom severity: mild  Duration of current treatment : Not currently on treatment Side effects: no Medication compliance: excellent compliance Psychotherapy/counseling: no  Previous psychiatric medications: None Depressed mood: yes Anxious mood: no Anhedonia: yes Significant weight loss or gain: yes Insomnia: yes  Fatigue: yes Feelings of worthlessness or guilt: no Impaired concentration/indecisiveness: yes Suicidal ideations: no Hopelessness: no Crying spells: no Depression screen [facility] 2/9 02/13/2020 07/12/2019 08/25/2016  Decreased Interest 3 0 0  Down, Depressed, Hopeless 2 0 0  PHQ - 2 Score 5 0 0  Altered sleeping 2 - -  Tired, decreased energy 2 - -  Change in appetite 2 - -  Feeling bad or failure about yourself  1 - -  Trouble concentrating 1 - -  Moving slowly or fidgety/restless 1 - -  Suicidal thoughts 0 - -  PHQ-9 Score 14 - -  Difficult doing work/chores Somewhat difficult - -   No Known Allergies  Outpatient Encounter Medications as of 02/13/2020  Medication Sig   clotrimazole (LOTRIMIN) 1 % cream Apply 1 application topically 2 (two) times daily.   No facility-administered encounter medications on file as of 02/13/2020.   Patient Active Problem List   Diagnosis Date Noted   Penis pain 02/13/2020   Decreased libido 02/13/2020   Past Medical History:  Diagnosis Date  HSV-2 (herpes simplex virus 2) infection    Relevant past medical, surgical, family and social history reviewed and updated as indicated. Interim medical history since our last visit reviewed.  Review of Systems  Constitutional: Positive for activity change, fatigue, fever and unexpected weight change. Negative for appetite change, chills and diaphoresis.  Genitourinary: Positive for frequency, genital sores, penile pain and  testicular pain. Negative for decreased urine volume, difficulty urinating, discharge, dysuria, enuresis, flank pain, hematuria, penile swelling, scrotal swelling and urgency.  Musculoskeletal: Negative.   Skin: Positive for rash. Negative for color change and wound.  Neurological: Negative.   Psychiatric/Behavioral: Positive for decreased concentration and sleep disturbance. Negative for agitation, behavioral problems, confusion, self-injury and suicidal ideas. The patient is not nervous/anxious and is not hyperactive.     Per HPI unless specifically indicated above     Objective:    BP 107/65    Pulse 79    Temp 98.7 F (37.1 C) (Oral)    Wt 196 lb (88.9 kg)    SpO2 97%    BMI 30.88 kg/m   Wt Readings from Last 3 Encounters:  02/13/20 196 lb (88.9 kg)  07/12/19 184 lb (83.5 kg)  08/25/16 172 lb (78 kg)    Physical Exam Vitals and nursing note reviewed. Exam conducted with a chaperone present.  Constitutional:      General: He is not in acute distress.    Appearance: Normal appearance. He is not toxic-appearing.  Abdominal:     Hernia: There is no hernia in the left inguinal area or right inguinal area.  Genitourinary:    Pubic Area: No rash or pubic lice.      Penis: Uncircumcised.      Testes: Normal.        Right: Mass, tenderness or swelling not present. Right testis is descended.        Left: Mass, tenderness or swelling not present. Left testis is descended.     Epididymis:     Right: Normal.     Left: Normal.       Comments: Pinpoint erythematous areas noted to head of penis. No exudate, swelling, or warmth noted. Musculoskeletal:        General: Normal range of motion.     Comments: Moving all 4 extremities equally  Skin:    Capillary Refill: Capillary refill takes less than 2 seconds.  Neurological:     General: No focal deficit present.     Mental Status: He is alert and oriented to person, place, and time.     Motor: No weakness.     Gait: Gait normal.    Psychiatric:        Mood and Affect: Mood normal.        Behavior: Behavior normal.        Thought Content: Thought content normal.        Judgment: Judgment normal.       Assessment & Plan:   Problem List Items Addressed This Visit      Other   Penis pain - Primary    Acute, ongoing.  With pinpoint erythematous areas noted to head of penis, likely dermatitis from harsh soaps when cleaning genitalia.  Also likely exacerbated with recent sexual activity.  Encouraged patient to continue cleaning with water only and apply moisturizer and let dry fully prior to replacing foreskin over head of penis.  Will obtain urinalysis, GC chlamydia testing, and HIV/syphilis testing today.  Encourage patient to let the area heal  fully prior to sexual activity.  Encouraged using condoms with each sexual encounter.  If dermatitis persist, patient to return to clinic.      Relevant Orders   GC/Chlamydia Probe Amp   UA/M w/rflx Culture, Routine   HIV Antibody (routine testing w rflx)   RPR   Decreased libido    Acute, ongoing.  Unclear etiology if related to recent changes to skin of penis or if ongoing issue prior to this time.  Discussed potential for medication to help with mood versus therapy.  Agreeable to check testosterone-want measure of testosterone for see in the morning for accuracy.  Patient to return to clinic to check testosterone.      Relevant Orders   Testosterone, Free, Total, SH[facility]       Follow up plan: Return if symptoms worsen or fail to improve, for am appt to have testosterone checked.

## 2020-02-14 LAB — MICROSCOPIC EXAMINATION
Bacteria, UA: NONE SEEN
WBC, UA: NONE SEEN /hpf (ref 0–5)

## 2020-02-14 LAB — RPR: RPR Ser Ql: NONREACTIVE

## 2020-02-14 LAB — UA/M W/RFLX CULTURE, ROUTINE
Bilirubin, UA: NEGATIVE
Glucose, UA: NEGATIVE
Ketones, UA: NEGATIVE
Leukocytes,UA: NEGATIVE
Nitrite, UA: NEGATIVE
RBC, UA: NEGATIVE
Specific Gravity, UA: 1.02 (ref 1.005–1.030)
Urobilinogen, Ur: 0.2 mg/dL (ref 0.2–1.0)
pH, UA: 6 (ref 5.0–7.5)

## 2020-02-14 LAB — HIV ANTIBODY (ROUTINE TESTING W REFLEX): HIV Screen 4th Generation wRfx: NONREACTIVE

## 2020-02-15 ENCOUNTER — Telehealth: Payer: Self-pay | Admitting: Nurse Practitioner

## 2020-02-15 LAB — GC/CHLAMYDIA PROBE AMP
Chlamydia trachomatis, NAA: NEGATIVE
Neisseria Gonorrhoeae by PCR: NEGATIVE

## 2020-02-15 NOTE — Telephone Encounter (Signed)
Patient called and was read lab note by Cathlean Marseilles written 02/15/20.  He verbalized understanding of all information and states his symptoms are improving. He will call if symptoms worsen or fail to improve.

## 2020-03-04 ENCOUNTER — Other Ambulatory Visit: Payer: Self-pay

## 2020-03-04 ENCOUNTER — Encounter: Payer: Self-pay | Admitting: Nurse Practitioner

## 2020-03-04 ENCOUNTER — Ambulatory Visit: Payer: 59 | Admitting: Nurse Practitioner

## 2020-03-04 VITALS — BP 105/64 | HR 80 | Temp 98.5°F | Ht 67.0 in | Wt 187.4 lb

## 2020-03-04 DIAGNOSIS — J309 Allergic rhinitis, unspecified: Secondary | ICD-10-CM

## 2020-03-04 DIAGNOSIS — N50812 Left testicular pain: Secondary | ICD-10-CM | POA: Diagnosis not present

## 2020-03-04 DIAGNOSIS — N50811 Right testicular pain: Secondary | ICD-10-CM | POA: Diagnosis not present

## 2020-03-04 DIAGNOSIS — R5383 Other fatigue: Secondary | ICD-10-CM

## 2020-03-04 MED ORDER — DOXYCYCLINE HYCLATE 100 MG PO TABS: 100.0000 mg | ORAL_TABLET | Freq: Two times a day (BID) | ORAL | 0 refills | Status: DC

## 2020-03-04 NOTE — Assessment & Plan Note (Signed)
Acute and ongoing.  Will initiate Doxycycline 100 MG BID, script sent, possible epididymitis present left side.  Obtain scrotal u/s to further evaluate.  Recommend he wear supportive underwear, no boxers.  Continue to practice safe sex.  HSV testing on labs today.  Return for worsening or ongoing symptoms.

## 2020-03-04 NOTE — Addendum Note (Signed)
Addended by: Aura Dials T on: 03/04/2020 09:55 AM   Modules accepted: Orders

## 2020-03-04 NOTE — Progress Notes (Signed)
BP 105/64    Pulse 80    Temp 98.5 F (36.9 C) (Oral)    Ht 5\' 7"  (1.702 m)    Wt 187 lb 6.4 oz (85 kg)    SpO2 96%    BMI 29.35 kg/m    Subjective:    Patient ID: , male    DOB: [birth year], 29 y.o.   MRN: [id number]  HPI: Sean Black is a 29 y.o. male  Chief Complaint  Patient presents with   Penis Pain   labwork    patient is requesting Testosterone, TSH lab work, patient has been feeling tired   Allergy Testing    patient would like to be tested for allergies   TESTICLE PAIN: Ongoing discomfort originally to penis but reports now to testicles, was seen on 02/13/20 for this in office.  STD testing negative with exception of HSV, which was not obtained.  Was seen at [facility] for this and treated for yeast with Clotrimazole in past.  Reports testicular pain to posterior aspect, before it was sharp and quick pain, but this weekend it lasted longer and was stronger.  To both sides, stronger on left.  Would like to have his testosterone levels checked today as he has had fatigue for about one month.  He reports 5 years ago he feels he had hernia to left side, as was lifting weights and had something pop down below, which healed own. Duration: weeks Onset: sudden Severity: mild to moderate Quality: sharp and quick Frequency: intermittent -- noted more when doing physical activity Episode duration: seconds Alleviating factors: nothing tried Aggravating factors: physical activity at times Previous evaluation/studies: previous STD testing negative, although HSV not obtained Treatments attempted: nothing Fevers: none Nausea/vomiting: no Penis discharge: no  Sexually active: yes  Dyspareunia: none Use of condoms: sometimes Weight loss: no Dysuria/urinary frequency: no Hematuria: no Sexual activity: yes; one male sexual partner History STDs: no  ALLERGIES He would like to have allergy testing.  Feels he is allergic to "some things".  Thinks he  is allergic to pollen. Duration: months Symptoms occur seasonally: yes Symptoms occur perenially: no Satisfied with current treatment: yes Allergist evaluation in past: no Allergen injection immunotherapy: no Recurrent sinus infections: no ENT evaluation in past: no Known environmental allergy: no Indoor pets: no History of asthma: no Current allergy medications: OTC medications, -- does not know name Treatments attempted: as above  Relevant past medical, surgical, family and social history reviewed and updated as indicated. Interim medical history since our last visit reviewed. Allergies and medications reviewed and updated.  Review of Systems  Constitutional: Positive for fatigue. Negative for activity change, diaphoresis and fever.  Respiratory: Negative for cough, chest tightness, shortness of breath and wheezing.   Cardiovascular: Negative for chest pain, palpitations and leg swelling.  Endocrine: Negative for cold intolerance and heat intolerance.  Genitourinary: Positive for testicular pain. Negative for discharge, dysuria, frequency, hematuria, penile pain, penile swelling, scrotal swelling and urgency.  Psychiatric/Behavioral: Negative.     Per HPI unless specifically indicated above     Objective:    BP 105/64    Pulse 80    Temp 98.5 F (36.9 C) (Oral)    Ht 5\' 7"  (1.702 m)    Wt 187 lb 6.4 oz (85 kg)    SpO2 96%    BMI 29.35 kg/m   Wt Readings from Last 3 Encounters:  03/04/20 187 lb 6.4 oz (85 kg)  02/13/20 196 lb (88.9 kg)  07/12/19 184 lb (83.5 kg)    Physical Exam Vitals and nursing note reviewed.  Constitutional:      General: He is awake. He is not in acute distress.    Appearance: He is well-developed and well-groomed. He is not ill-appearing.  HENT:     Head: Normocephalic and atraumatic.     Right Ear: Hearing normal. No drainage.     Left Ear: Hearing normal. No drainage.  Eyes:     General: Lids are normal.        Right eye: No discharge.          Left eye: No discharge.     Conjunctiva/sclera: Conjunctivae normal.     Pupils: Pupils are equal, round, and reactive to light.  Neck:     Thyroid: No thyromegaly.     Vascular: No carotid bruit.     Trachea: Trachea normal.  Cardiovascular:     Rate and Rhythm: Normal rate and regular rhythm.     Heart sounds: Normal heart sounds, S1 normal and S2 normal. No murmur heard.  No gallop.   Pulmonary:     Effort: Pulmonary effort is normal. No accessory muscle usage or respiratory distress.     Breath sounds: Normal breath sounds.  Abdominal:     General: Bowel sounds are normal.     Palpations: Abdomen is soft.     Hernia: There is no hernia in the left inguinal area or right inguinal area.  Genitourinary:    Penis: Normal and uncircumcised.      Testes: Normal.        Right: Mass, tenderness or swelling not present. Cremasteric reflex is present.         Left: Mass, tenderness or swelling not present. Cremasteric reflex is present.      Comments: No hernias noted, mild inflammation in left epididymis noted, right normal.  No discomfort with exam.  He declined chaperone. Musculoskeletal:        General: Normal range of motion.     Cervical back: Normal range of motion and neck supple.     Right lower leg: No edema.     Left lower leg: No edema.  Skin:    General: Skin is warm and dry.     Capillary Refill: Capillary refill takes less than 2 seconds.     Findings: No rash.  Neurological:     Mental Status: He is alert and oriented to person, place, and time.     Deep Tendon Reflexes: Reflexes are normal and symmetric.  Psychiatric:        Attention and Perception: Attention normal.        Mood and Affect: Mood normal.        Speech: Speech normal.        Behavior: Behavior normal. Behavior is cooperative.        Thought Content: Thought content normal.     Results for orders placed or performed in visit on 02/13/20  GC/Chlamydia Probe Amp   Specimen: Blood   UR   Result Value Ref Range   Chlamydia trachomatis, NAA Negative Negative   Neisseria Gonorrhoeae by PCR Negative Negative  Microscopic Examination   Urine  Result Value Ref Range   WBC, UA None seen 0 - 5 /hpf   RBC 0-2 0 - 2 /hpf   Epithelial Cells (non renal) 0-10 0 - 10 /hpf   Bacteria, UA None seen None seen/Few  UA/M w/rflx Culture, Routine   Specimen: Urine  Urine  Result Value Ref Range   Specific Gravity, UA 1.020 1.005 - 1.030   pH, UA 6.0 5.0 - 7.5   Color, UA Yellow Yellow   Appearance Ur Clear Clear   Leukocytes,UA Negative Negative   Protein,UA 1+ (A) Negative/Trace   Glucose, UA Negative Negative   Ketones, UA Negative Negative   RBC, UA Negative Negative   Bilirubin, UA Negative Negative   Urobilinogen, Ur 0.2 0.2 - 1.0 mg/dL   Nitrite, UA Negative Negative   Microscopic Examination See below:   HIV Antibody (routine testing w rflx)  Result Value Ref Range   HIV Screen 4th Generation wRfx Non Reactive Non Reactive  RPR  Result Value Ref Range   RPR Ser Ql Non Reactive Non Reactive      Assessment & Plan:   Problem List Items Addressed This Visit      Other   Pain in both testicles - Primary    Acute and ongoing.  Will initiate Doxycycline 100 MG BID, script sent, possible epididymitis present left side.  Obtain scrotal u/s to further evaluate.  Recommend he wear supportive underwear, no boxers.  Continue to practice safe sex.  HSV testing on labs today.  Return for worsening or ongoing symptoms.      Relevant Orders   HSV(herpes simplex vrs) 1+2 ab-IgG   US Scrotum    Other Visit Diagnoses    Fatigue, unspecified type       Obtain testosterone labs and TSH today per patient request.   Relevant Orders   Testosterone, free, total([facility])   TSH   Allergic rhinitis, unspecified seasonality, unspecified trigger       Referral to allergist placed for further testing, per patient request.   Relevant Orders   Ambulatory referral to Allergy        Follow up plan: Return if symptoms worsen or fail to improve.

## 2020-03-04 NOTE — Addendum Note (Signed)
Addended by: Aura Dials T on: 03/04/2020 10:22 AM   Modules accepted: Orders

## 2020-03-04 NOTE — Patient Instructions (Signed)
Testicular Self-Exam A self-examination of your testicles (testicular self-exam) involves looking at and feeling your testicles for abnormal lumps or swelling. Several things can cause swelling, lumps, or pain in your testicles. Some of these causes are:  Injuries.  Inflammation.  Infection.  Buildup of fluids around your testicle (hydrocele).  Twisted testicles (testicular torsion).  Testicular cancer. Why is it important to do a testicular self-exam? Self-examination of the testicles and the left and right groin areas may be recommended if you are at risk for testicular cancer. Your groin is where your lower abdomen meets your upper thighs. You may be at risk for testicular cancer if you have:  An undescended testicle (cryptorchidism).  A history of previous testicular cancer.  A family history of testicular cancer. How to do a testicular self-exam The testicles are easiest to examine after a warm bath or shower. They are more difficult to examine when you are cold. This is because the muscles attached to the testicles retract and pull them up higher or into the abdomen. A normal testicle is egg-shaped and feels firm. It is smooth and not tender. The spermatic cord can be felt as a firm, spaghetti-like cord at the back of your testicle. Look and feel for changes  Stand and hold your penis away from your body.  Look at each testicle to check for lumps or swelling.  Roll each testicle between your thumb and forefinger, feeling the entire testicle. Feel for: ? Lumps. ? Swelling. ? Discomfort.  Check the groin area between your abdomen and upper thighs on both sides of your body. Look and feel for any swelling or bumps that are tender. These could be enlarged lymph nodes. Contact a health care provider if:  You find any bumps or lumps, such as a small, hard, pea-sized lump.  You find swelling, pain, or soreness.  You see or feel any other changes in your testicles. Summary   A self-examination of your testicles (testicular self-exam) involves looking at and feeling your testicles for any changes.  Self-examination of the testicles and the left and right groin areas may be recommended if you are at risk for testicular cancer.  You should check each of your testicles for lumps, swelling, or discomfort.  You should check for swelling or tender bumps in your groin area between your lower abdomen and upper thighs. This information is not intended to replace advice given to you by your health care provider. Make sure you discuss any questions you have with your health care provider. Document Revised: 08/11/2018 Document Reviewed: 03/16/2016 Elsevier Patient Education  2020 [employer]  

## 2020-03-05 LAB — TESTOSTERONE, FREE, TOTAL, SHBG
Sex Hormone Binding: 14.1 nmol/L — ABNORMAL LOW (ref 16.5–55.9)
Testosterone, Free: 33.9 pg/mL — ABNORMAL HIGH (ref 8.7–25.1)
Testosterone: 693 ng/dL (ref 264–916)

## 2020-03-05 LAB — TSH: TSH: 3.24 u[IU]/mL (ref 0.450–4.500)

## 2020-03-05 NOTE — Progress Notes (Signed)
Contacted via MyChart  Good afternoon Adair, your labs have returned.  Testosterone levels are normal, no concerns on these.  Thyroid testing is also normal.  If any questions please let me know. Keep being awesome!!  Thank you for allowing me to participate in your care. Kindest regards, Divante Kotch

## 2020-03-07 ENCOUNTER — Other Ambulatory Visit: Payer: Self-pay

## 2020-03-07 ENCOUNTER — Ambulatory Visit
Admission: RE | Admit: 2020-03-07 | Discharge: 2020-03-07 | Disposition: A | Discharge status: Home / Self Care | Payer: 59 | Source: Ambulatory Visit | Attending: Nurse Practitioner | Admitting: Nurse Practitioner

## 2020-03-07 DIAGNOSIS — N50812 Left testicular pain: Secondary | ICD-10-CM | POA: Insufficient documentation

## 2020-03-07 DIAGNOSIS — N50811 Right testicular pain: Secondary | ICD-10-CM | POA: Insufficient documentation

## 2020-03-08 NOTE — Progress Notes (Signed)
Contacted via MyChart  Good evening Mr. Sean Black, your imaging has returned.  It did note a couple things.  First you have an epididymal head cyst, this is a benign cyst in the tube behind your right testicle.  You also look like you may have a small inguinal hernia on the left side.  Both of these could occasionally cause discomfort on either side.  I would be glad to place a referral to urology to discuss options for these since you do have pain on occasion.  Would you like a referral?  Let me know and let me know if any questions. Keep being awesome!!  Thank you for allowing me to participate in your care. Kindest regards, Yosiah Jasmin

## 2020-03-13 ENCOUNTER — Other Ambulatory Visit: Payer: Self-pay | Admitting: Nurse Practitioner

## 2020-03-13 DIAGNOSIS — K4090 Unilateral inguinal hernia, without obstruction or gangrene, not specified as recurrent: Secondary | ICD-10-CM

## 2020-03-13 DIAGNOSIS — N503 Cyst of epididymis: Secondary | ICD-10-CM

## 2020-04-02 ENCOUNTER — Ambulatory Visit: Payer: 59 | Admitting: Urology

## 2020-04-03 ENCOUNTER — Encounter: Payer: Self-pay | Admitting: Urology

## 2020-04-25 DIAGNOSIS — R21 Rash and other nonspecific skin eruption: Secondary | ICD-10-CM | POA: Diagnosis not present

## 2020-04-25 DIAGNOSIS — J309 Allergic rhinitis, unspecified: Secondary | ICD-10-CM | POA: Diagnosis not present

## 2020-06-13 DIAGNOSIS — Z79899 Other long term (current) drug therapy: Secondary | ICD-10-CM | POA: Diagnosis not present

## 2020-06-13 DIAGNOSIS — L700 Acne vulgaris: Secondary | ICD-10-CM | POA: Diagnosis not present

## 2020-06-20 DIAGNOSIS — Z20822 Contact with and (suspected) exposure to covid-19: Secondary | ICD-10-CM | POA: Diagnosis not present

## 2020-07-11 DIAGNOSIS — Z79899 Other long term (current) drug therapy: Secondary | ICD-10-CM | POA: Diagnosis not present

## 2020-07-11 DIAGNOSIS — L700 Acne vulgaris: Secondary | ICD-10-CM | POA: Diagnosis not present

## 2020-07-12 ENCOUNTER — Encounter: Payer: Self-pay | Admitting: Nurse Practitioner

## 2020-07-12 ENCOUNTER — Other Ambulatory Visit: Payer: Self-pay

## 2020-07-12 ENCOUNTER — Ambulatory Visit (INDEPENDENT_AMBULATORY_CARE_PROVIDER_SITE_OTHER): Payer: BC Managed Care – PPO | Admitting: Nurse Practitioner

## 2020-07-12 VITALS — BP 93/60 | HR 57 | Temp 98.0°F | Ht 68.0 in | Wt 197.2 lb

## 2020-07-12 DIAGNOSIS — Z0000 Encounter for general adult medical examination without abnormal findings: Secondary | ICD-10-CM

## 2020-07-12 DIAGNOSIS — Z1159 Encounter for screening for other viral diseases: Secondary | ICD-10-CM

## 2020-07-12 DIAGNOSIS — Z1329 Encounter for screening for other suspected endocrine disorder: Secondary | ICD-10-CM | POA: Diagnosis not present

## 2020-07-12 DIAGNOSIS — Z13220 Encounter for screening for lipoid disorders: Secondary | ICD-10-CM | POA: Diagnosis not present

## 2020-07-12 NOTE — Progress Notes (Signed)
BP 93/60   Pulse (!) 57   Temp 98 F (36.7 C) (Oral)   Ht 5\' 8"  (1.727 m)   Wt 197 lb 3.2 oz (89.4 kg)   SpO2 99%   BMI 29.98 kg/m    Subjective:    Patient ID: , male    DOB: 04-16-1991, 30 y.o.   MRN: [id number]  HPI: Sean Black is a 30 y.o. male presenting on 07/12/2020 for comprehensive medical examination. Current medical complaints include:none  He currently lives with: self Interim Problems from his last visit: no   No acute concerns today reported.    FALL RISK: Fall Risk  07/12/2019 08/25/2016  Falls in the past year? 0 No  Number falls in past yr: 0 -  Injury with Fall? 0 -  Follow up Falls evaluation completed -    Depression Screen Depression screen [facility] 2/9 07/12/2020 02/13/2020 07/12/2019 08/25/2016  Decreased Interest 0 3 0 0  Down, Depressed, Hopeless 0 2 0 0  PHQ - 2 Score 0 5 0 0  Altered sleeping 1 2 - -  Tired, decreased energy 1 2 - -  Change in appetite 1 2 - -  Feeling bad or failure about yourself  0 1 - -  Trouble concentrating 0 1 - -  Moving slowly or fidgety/restless 0 1 - -  Suicidal thoughts 0 0 - -  PHQ-9 Score 3 14 - -  Difficult doing work/chores - Somewhat difficult - -    Advanced Directives <no information>  Past Medical History:  Past Medical History:  Diagnosis Date  . HSV-2 (herpes simplex virus 2) infection     Surgical History:  Past Surgical History:  Procedure Laterality Date  . wisdom tooth  removal Bilateral     Medications:  Current Outpatient Medications on File Prior to Visit  Medication Sig  . ISOtretinoin (ACCUTANE) 20 MG capsule See admin instructions.  . clotrimazole (LOTRIMIN) 1 % cream Apply 1 application topically 2 (two) times daily. (Patient not taking: Reported on 07/12/2020)  . doxycycline (VIBRA-TABS) 100 MG tablet Take 1 tablet (100 mg total) by mouth 2 (two) times daily. (Patient not taking: Reported on 07/12/2020)   No current facility-administered  medications on file prior to visit.    Allergies:  No Known Allergies  Social History:  Social History   Socioeconomic History  . Marital status: Single    Spouse name: Not on file  . Number of children: Not on file  . Years of education: Not on file  . Highest education level: Not on file  Occupational History  . Not on file  Tobacco Use  . Smoking status: Never Smoker  . Smokeless tobacco: Never Used  Vaping Use  . Vaping Use: Never used  Substance and Sexual Activity  . Alcohol use: Yes    Comment: occasional  . Drug use: No  . Sexual activity: Not on file  Other Topics Concern  . Not on file  Social History Narrative  . Not on file   Social Determinants of Health   Financial Resource Strain: Not on file  Food Insecurity: Not on file  Transportation Needs: Not on file  Physical Activity: Not on file  Stress: Not on file  Social Connections: Not on file  Intimate Partner Violence: Not on file   Social History   Tobacco Use  Smoking Status Never Smoker  Smokeless Tobacco Never Used   Social History   Substance and Sexual Activity  Alcohol  Use Yes   Comment: occasional    Family History:  Family History  Problem Relation Age of Onset  . Diabetes Father   . Diabetes Maternal Grandmother     Past medical history, surgical history, medications, allergies, family history and social history reviewed with patient today and changes made to appropriate areas of the chart.   Review of Systems - negative All other ROS negative except what is listed above and in the HPI.      Objective:    BP 93/60   Pulse (!) 57   Temp 98 F (36.7 C) (Oral)   Ht 5\' 8"  (1.727 m)   Wt 197 lb 3.2 oz (89.4 kg)   SpO2 99%   BMI 29.98 kg/m   Wt Readings from Last 3 Encounters:  07/12/20 197 lb 3.2 oz (89.4 kg)  03/04/20 187 lb 6.4 oz (85 kg)  02/13/20 196 lb (88.9 kg)    Physical Exam Vitals and nursing note reviewed.  Constitutional:      General: He is awake.  He is not in acute distress.    Appearance: He is well-developed and well-groomed. He is not ill-appearing.  HENT:     Head: Normocephalic and atraumatic.     Right Ear: Hearing, tympanic membrane, ear canal and external ear normal. No drainage.     Left Ear: Hearing, tympanic membrane, ear canal and external ear normal. No drainage.     Nose: Nose normal.     Mouth/Throat:     Pharynx: Uvula midline.  Eyes:     General: Lids are normal.        Right eye: No discharge.        Left eye: No discharge.     Extraocular Movements: Extraocular movements intact.     Conjunctiva/sclera: Conjunctivae normal.     Pupils: Pupils are equal, round, and reactive to light.     Visual Fields: Right eye visual fields normal and left eye visual fields normal.  Neck:     Thyroid: No thyromegaly.     Vascular: No carotid bruit or JVD.     Trachea: Trachea normal.  Cardiovascular:     Rate and Rhythm: Normal rate and regular rhythm.     Heart sounds: Normal heart sounds, S1 normal and S2 normal. No murmur heard. No gallop.   Pulmonary:     Effort: Pulmonary effort is normal. No accessory muscle usage or respiratory distress.     Breath sounds: Normal breath sounds.  Abdominal:     General: Bowel sounds are normal.     Palpations: Abdomen is soft. There is no hepatomegaly or splenomegaly.     Tenderness: There is no abdominal tenderness.  Musculoskeletal:        General: Normal range of motion.     Cervical back: Normal range of motion and neck supple.     Right lower leg: No edema.     Left lower leg: No edema.  Lymphadenopathy:     Head:     Right side of head: No submental, submandibular, tonsillar, preauricular or posterior auricular adenopathy.     Left side of head: No submental, submandibular, tonsillar, preauricular or posterior auricular adenopathy.     Cervical: No cervical adenopathy.  Skin:    General: Skin is warm and dry.     Capillary Refill: Capillary refill takes less than 2  seconds.     Findings: No rash.  Neurological:     Mental Status: He is alert and  oriented to person, place, and time.     Cranial Nerves: Cranial nerves are intact.     Gait: Gait is intact.     Deep Tendon Reflexes: Reflexes are normal and symmetric.     Reflex Scores:      Brachioradialis reflexes are 2+ on the right side and 2+ on the left side.      Patellar reflexes are 2+ on the right side and 2+ on the left side. Psychiatric:        Attention and Perception: Attention normal.        Mood and Affect: Mood normal.        Speech: Speech normal.        Behavior: Behavior normal. Behavior is cooperative.        Thought Content: Thought content normal.        Cognition and Memory: Cognition normal.        Judgment: Judgment normal.    Results for orders placed or performed in visit on 03/04/20  Testosterone, free, total([facility])  Result Value Ref Range   Testosterone 693 264 - 916 ng/dL   Testosterone, Free 16.133.9 (H) 8.7 - 25.1 pg/mL   Sex Hormone Binding 14.1 (L) 16.5 - 55.9 nmol/L  TSH  Result Value Ref Range   TSH 3.240 0.450 - 4.500 uIU/mL      Assessment & Plan:   Problem List Items Addressed This Visit   None   Visit Diagnoses    Encounter for annual physical exam    -  Primary   Annual physical today with labs CBC, CMP, TSH, lipid.  Health maintenance reviewed with patient.   Relevant Orders   CBC with Differential/Platelet   Comprehensive metabolic panel   Need for hepatitis C screening test       Hep C screening on labs today   Relevant Orders   Hepatitis C antibody   Thyroid disorder screening       TSH on labs today   Relevant Orders   TSH   Screening for cholesterol level       Lipid panel on labs today.   Relevant Orders   Lipid Panel w/o Chol/HDL Ratio      Discussed aspirin prophylaxis for myocardial infarction prevention and decision was it was not indicated  LABORATORY TESTING:  Health maintenance labs ordered today as discussed  above.   IMMUNIZATIONS:   - Tdap: Tetanus vaccination status reviewed: last tetanus booster within 10 years. - Influenza: Refused - Pneumovax: Not applicable - Prevnar: Not applicable - Zostavax vaccine: Not applicable  SCREENING: - Colonoscopy: Not applicable  Discussed with patient purpose of the colonoscopy is to detect colon cancer at curable precancerous or early stages   - AAA Screening: Not applicable  -Hearing Test: Not applicable  -Spirometry: Not applicable   PATIENT COUNSELING:    Sexuality: Discussed sexually transmitted diseases, partner selection, use of condoms, avoidance of unintended pregnancy  and contraceptive alternatives.   Advised to avoid cigarette smoking.  I discussed with the patient that most people either abstain from alcohol or drink within safe limits (<=14/week and <=4 drinks/occasion for males, <=7/weeks and <= 3 drinks/occasion for females) and that the risk for alcohol disorders and other health effects rises proportionally with the number of drinks per week and how often a drinker exceeds daily limits.  Discussed cessation/primary prevention of drug use and availability of treatment for abuse.   Diet: Encouraged to adjust caloric intake to maintain  or achieve ideal body  weight, to reduce intake of dietary saturated fat and total fat, to limit sodium intake by avoiding high sodium foods and not adding table salt, and to maintain adequate dietary potassium and calcium preferably from fresh fruits, vegetables, and low-fat dairy products.    Stressed the importance of regular exercise  Injury prevention: Discussed safety belts, safety helmets, smoke detector, smoking near bedding or upholstery.   Dental health: Discussed importance of regular tooth brushing, flossing, and dental visits.   Follow up plan: NEXT PREVENTATIVE PHYSICAL DUE IN 1 YEAR. Return in about 1 year (around 07/12/2021) for Annual physical.

## 2020-07-12 NOTE — Patient Instructions (Signed)
Healthy Eating Following a healthy eating pattern may help you to achieve and maintain a healthy body weight, reduce the risk of chronic disease, and live a long and productive life. It is important to follow a healthy eating pattern at an appropriate calorie level for your body. Your nutritional needs should be met primarily through food by choosing a variety of nutrient-rich foods. What are tips for following this plan? Reading food labels  Read labels and choose the following: ? Reduced or low sodium. ? Juices with 100% fruit juice. ? Foods with low saturated fats and high polyunsaturated and monounsaturated fats. ? Foods with whole grains, such as whole wheat, cracked wheat, brown rice, and wild rice. ? Whole grains that are fortified with folic acid. This is recommended for women who are pregnant or who want to become pregnant.  Read labels and avoid the following: ? Foods with a lot of added sugars. These include foods that contain brown sugar, corn sweetener, corn syrup, dextrose, fructose, glucose, high-fructose corn syrup, honey, invert sugar, lactose, malt syrup, maltose, molasses, raw sugar, sucrose, trehalose, or turbinado sugar.  Do not eat more than the following amounts of added sugar per day:  6 teaspoons (25 g) for women.  9 teaspoons (38 g) for men. ? Foods that contain processed or refined starches and grains. ? Refined grain products, such as white flour, degermed cornmeal, white bread, and white rice. Shopping  Choose nutrient-rich snacks, such as vegetables, whole fruits, and nuts. Avoid high-calorie and high-sugar snacks, such as potato chips, fruit snacks, and candy.  Use oil-based dressings and spreads on foods instead of solid fats such as butter, stick margarine, or cream cheese.  Limit pre-made sauces, mixes, and "instant" products such as flavored rice, instant noodles, and ready-made pasta.  Try more plant-protein sources, such as tofu, tempeh, black beans,  edamame, lentils, nuts, and seeds.  Explore eating plans such as the Mediterranean diet or vegetarian diet. Cooking  Use oil to saut or stir-fry foods instead of solid fats such as butter, stick margarine, or lard.  Try baking, boiling, grilling, or broiling instead of frying.  Remove the fatty part of meats before cooking.  Steam vegetables in water or broth. Meal planning  At meals, imagine dividing your plate into fourths: ? One-half of your plate is fruits and vegetables. ? One-fourth of your plate is whole grains. ? One-fourth of your plate is protein, especially lean meats, poultry, eggs, tofu, beans, or nuts.  Include low-fat dairy as part of your daily diet.   Lifestyle  Choose healthy options in all settings, including home, work, school, restaurants, or stores.  Prepare your food safely: ? Wash your hands after handling raw meats. ? Keep food preparation surfaces clean by regularly washing with hot, soapy water. ? Keep raw meats separate from ready-to-eat foods, such as fruits and vegetables. ? Cook seafood, meat, poultry, and eggs to the recommended internal temperature. ? Store foods at safe temperatures. In general:  Keep cold foods at 7F (4.4C) or below.  Keep hot foods at 17F (60C) or above.  Keep your freezer at [facility] (-17.8C) or below.  Foods are no longer safe to eat when they have been between the temperatures of 40-17F (4.4-60C) for more than 2 hours. What foods should I eat? Fruits Aim to eat 2 cup-equivalents of fresh, canned (in natural juice), or frozen fruits each day. Examples of 1 cup-equivalent of fruit include 1 small apple, 8 large strawberries, 1 cup canned fruit,  cup dried fruit, or 1 cup 100% juice. Vegetables Aim to eat 2-3 cup-equivalents of fresh and frozen vegetables each day, including different varieties and colors. Examples of 1 cup-equivalent of vegetables include 2 medium carrots, 2 cups raw, leafy greens, 1 cup chopped  vegetable (raw or cooked), or 1 medium baked potato. Grains Aim to eat 6 ounce-equivalents of whole grains each day. Examples of 1 ounce-equivalent of grains include 1 slice of bread, 1 cup ready-to-eat cereal, 3 cups popcorn, or  cup cooked rice, pasta, or cereal. Meats and other proteins Aim to eat 5-6 ounce-equivalents of protein each day. Examples of 1 ounce-equivalent of protein include 1 egg, 1/2 cup nuts or seeds, or 1 tablespoon (16 g) peanut butter. A cut of meat or fish that is the size of a deck of cards is about 3-4 ounce-equivalents.  Of the protein you eat each week, try to have at least 8 ounces come from seafood. This includes salmon, trout, herring, and anchovies. Dairy Aim to eat 3 cup-equivalents of fat-free or low-fat dairy each day. Examples of 1 cup-equivalent of dairy include 1 cup (240 mL) milk, 8 ounces (250 g) yogurt, 1 ounces (44 g) natural cheese, or 1 cup (240 mL) fortified soy milk. Fats and oils  Aim for about 5 teaspoons (21 g) per day. Choose monounsaturated fats, such as canola and olive oils, avocados, peanut butter, and most nuts, or polyunsaturated fats, such as sunflower, corn, and soybean oils, walnuts, pine nuts, sesame seeds, sunflower seeds, and flaxseed. Beverages  Aim for six 8-oz glasses of water per day. Limit coffee to three to five 8-oz cups per day.  Limit caffeinated beverages that have added calories, such as soda and energy drinks.  Limit alcohol intake to no more than 1 drink a day for nonpregnant women and 2 drinks a day for men. One drink equals 12 oz of beer (355 mL), 5 oz of wine (148 mL), or 1 oz of hard liquor (44 mL). Seasoning and other foods  Avoid adding excess amounts of salt to your foods. Try flavoring foods with herbs and spices instead of salt.  Avoid adding sugar to foods.  Try using oil-based dressings, sauces, and spreads instead of solid fats. This information is based on general [place] nutrition guidelines. For more  information, visit choosemyplate.gov. Exact amounts may vary based on your nutrition needs. Summary  A healthy eating plan may help you to maintain a healthy weight, reduce the risk of chronic diseases, and stay active throughout your life.  Plan your meals. Make sure you eat the right portions of a variety of nutrient-rich foods.  Try baking, boiling, grilling, or broiling instead of frying.  Choose healthy options in all settings, including home, work, school, restaurants, or stores. This information is not intended to replace advice given to you by your health care provider. Make sure you discuss any questions you have with your health care provider. Document Revised: 08/02/2017 Document Reviewed: 08/02/2017 Elsevier Patient Education  2021 Elsevier Inc.  

## 2020-07-13 LAB — CBC WITH DIFFERENTIAL/PLATELET
Basophils Absolute: 0 10*3/uL (ref 0.0–0.2)
Basos: 1 %
EOS (ABSOLUTE): 0.2 10*3/uL (ref 0.0–0.4)
Eos: 4 %
Hematocrit: 49.3 % (ref 37.5–51.0)
Hemoglobin: 16.2 g/dL (ref 13.0–17.7)
Immature Grans (Abs): 0 10*3/uL (ref 0.0–0.1)
Immature Granulocytes: 0 %
Lymphocytes Absolute: 1.8 10*3/uL (ref 0.7–3.1)
Lymphs: 38 %
MCH: 29.6 pg (ref 26.6–33.0)
MCHC: 32.9 g/dL (ref 31.5–35.7)
MCV: 90 fL (ref 79–97)
Monocytes Absolute: 0.4 10*3/uL (ref 0.1–0.9)
Monocytes: 8 %
Neutrophils Absolute: 2.4 10*3/uL (ref 1.4–7.0)
Neutrophils: 49 %
Platelets: 293 10*3/uL (ref 150–450)
RBC: 5.48 x10E6/uL (ref 4.14–5.80)
RDW: 13.1 % (ref 11.6–15.4)
WBC: 4.8 10*3/uL (ref 3.4–10.8)

## 2020-07-13 LAB — LIPID PANEL W/O CHOL/HDL RATIO
Cholesterol, Total: 204 mg/dL — ABNORMAL HIGH (ref 100–199)
HDL: 30 mg/dL — ABNORMAL LOW (ref >39)
LDL Chol Calc (NIH): 156 mg/dL — ABNORMAL HIGH (ref 0–99)
Triglycerides: 97 mg/dL (ref 0–149)
VLDL Cholesterol Cal: 18 mg/dL (ref 5–40)

## 2020-07-13 LAB — HEPATITIS C ANTIBODY: Hep C Virus Ab: <0.1 s/co ratio (ref 0.0–0.9)

## 2020-07-13 LAB — COMPREHENSIVE METABOLIC PANEL
ALT: 20 IU/L (ref 0–44)
AST: 21 IU/L (ref 0–40)
Albumin/Globulin Ratio: 1.7 (ref 1.2–2.2)
Albumin: 4.6 g/dL (ref 4.1–5.2)
Alkaline Phosphatase: 78 IU/L (ref 44–121)
BUN/Creatinine Ratio: 19 (ref 9–20)
BUN: 17 mg/dL (ref 6–20)
Bilirubin Total: 0.3 mg/dL (ref 0.0–1.2)
CO2: 20 mmol/L (ref 20–29)
Calcium: 9.3 mg/dL (ref 8.7–10.2)
Chloride: 105 mmol/L (ref 96–106)
Creatinine, Ser: 0.91 mg/dL (ref 0.76–1.27)
Globulin, Total: 2.7 g/dL (ref 1.5–4.5)
Glucose: 94 mg/dL (ref 65–99)
Potassium: 4.7 mmol/L (ref 3.5–5.2)
Sodium: 139 mmol/L (ref 134–144)
Total Protein: 7.3 g/dL (ref 6.0–8.5)
eGFR: 116 mL/min/{1.73_m2} (ref >59)

## 2020-07-13 LAB — TSH: TSH: 2.48 u[IU]/mL (ref 0.450–4.500)

## 2020-07-13 NOTE — Progress Notes (Signed)
Contacted via MyChart   Good morning Taro, your labs have returned and overall look normal with exception of cholesterol levels.  Your cholesterol is still high, but continued recommendations to make lifestyle changes. Your LDL is above normal. The LDL is the bad cholesterol. Over time and in combination with inflammation and other factors, this contributes to plaque which in turn may lead to stroke and/or heart attack down the road. Sometimes high LDL is primarily genetic, and people might be eating all the right foods but still have high numbers. Other times, there is room for improvement in one's diet and eating healthier can bring this number down and potentially reduce one's risk of heart attack and/or stroke.   To reduce your LDL, Remember - more fruits and vegetables, more fish, and limit red meat and dairy products. More soy, nuts, beans, barley, lentils, oats and plant sterol ester enriched margarine instead of butter. I also encourage eliminating sugar and processed food. Remember, shop on the outside of the grocery store and visit your [employer]. If you would like to talk with me about dietary changes for your cholesterol, please let me know. We should recheck your cholesterol in 12 months with you fasting.  Any questions? Keep being awesome!!  Thank you for allowing me to participate in your care. Kindest regards, Slevin Gunby

## 2020-08-15 DIAGNOSIS — Z79899 Other long term (current) drug therapy: Secondary | ICD-10-CM | POA: Diagnosis not present

## 2020-08-15 DIAGNOSIS — L700 Acne vulgaris: Secondary | ICD-10-CM | POA: Diagnosis not present

## 2020-09-12 DIAGNOSIS — Z79899 Other long term (current) drug therapy: Secondary | ICD-10-CM | POA: Diagnosis not present

## 2020-09-12 DIAGNOSIS — L853 Xerosis cutis: Secondary | ICD-10-CM | POA: Diagnosis not present

## 2020-09-12 DIAGNOSIS — Z7689 Persons encountering health services in other specified circumstances: Secondary | ICD-10-CM | POA: Diagnosis not present

## 2020-09-12 DIAGNOSIS — L700 Acne vulgaris: Secondary | ICD-10-CM | POA: Diagnosis not present

## 2020-09-16 DIAGNOSIS — L700 Acne vulgaris: Secondary | ICD-10-CM | POA: Diagnosis not present

## 2020-10-10 DIAGNOSIS — Z79899 Other long term (current) drug therapy: Secondary | ICD-10-CM | POA: Diagnosis not present

## 2020-10-10 DIAGNOSIS — L700 Acne vulgaris: Secondary | ICD-10-CM | POA: Diagnosis not present

## 2021-11-11 ENCOUNTER — Ambulatory Visit: Payer: Self-pay

## 2022-06-08 ENCOUNTER — Encounter: Payer: Self-pay | Admitting: Family Medicine

## 2022-06-08 ENCOUNTER — Encounter: Payer: Self-pay | Admitting: Nurse Practitioner

## 2022-06-08 ENCOUNTER — Ambulatory Visit: Payer: Self-pay | Admitting: Family Medicine

## 2022-06-08 DIAGNOSIS — Z113 Encounter for screening for infections with a predominantly sexual mode of transmission: Secondary | ICD-10-CM

## 2022-06-08 LAB — HM HIV SCREENING LAB: HM HIV Screening: NEGATIVE

## 2022-06-08 NOTE — Addendum Note (Signed)
Addended by: Sharlet Salina on: 06/08/2022 08:40 AM   Modules accepted: Orders

## 2022-06-08 NOTE — Progress Notes (Addendum)
[facility] Department STI clinic/screening visit  Subjective:  Sean Black is a 32 y.o. male being seen today for an STI screening visit. The patient reports they do not have symptoms.    Patient has the following medical conditions:   Patient Active Problem List   Diagnosis Date Noted   Pain in both testicles 03/04/2020   Penis pain 02/13/2020   Decreased libido 02/13/2020     Chief Complaint  Patient presents with   SEXUALLY TRANSMITTED DISEASE    Screening- patient states the is not having any symptoms     HPI  Patient reports to clinic for STI testing  Last HIV test per patient/review of record was  Lab Results  Component Value Date   HMHIVSCREEN Negative - Validated 06/30/2018    Lab Results  Component Value Date   HIV Non Reactive 02/13/2020    Does the patient or their partner desires a pregnancy in the next year? No  Screening for MPX risk: Does the patient have an unexplained rash? No Is the patient MSM? No Does the patient endorse multiple sex partners or anonymous sex partners? No Did the patient have close or sexual contact with a person diagnosed with MPX? No Has the patient traveled outside the [place] where MPX is endemic? No Is there a high clinical suspicion for MPX-- evidenced by one of the following No  -Unlikely to be chickenpox  -Lymphadenopathy  -Rash that present in same phase of evolution on any given body part   See flowsheet for further details and programmatic requirements.   Immunization History  Administered Date(s) Administered   Hepatitis A, Ped/Adol-2 Dose 07/08/2005, 01/29/2006   Hepatitis B, PED/ADOLESCENT 06/10/2005, 07/08/2005, 12/25/2005   Influenza,inj,Quad PF,6+ Mos 03/11/2015   Influenza-Unspecified 02/16/2014   Janssen (J&J) SARS-COV-2 Vaccination 12/30/2019, 04/25/2020   MMR 06/10/2005   Meningococcal Conjugate 01/29/2006   Tdap 03/11/2015     The following portions of the patient's history  were reviewed and updated as appropriate: allergies, current medications, past medical history, past social history, past surgical history and problem list.  Objective:  There were no vitals filed for this visit.  Physical Exam Vitals and nursing note reviewed.  Constitutional:      Appearance: Normal appearance.  HENT:     Head: Normocephalic and atraumatic.     Mouth/Throat:     Mouth: Mucous membranes are moist.     Pharynx: No oropharyngeal exudate or posterior oropharyngeal erythema.  Eyes:     General:        Right eye: No discharge.        Left eye: No discharge.     Conjunctiva/sclera:     Right eye: Right conjunctiva is not injected. No exudate.    Left eye: Left conjunctiva is not injected. No exudate. Pulmonary:     Effort: Pulmonary effort is normal.  Abdominal:     General: Abdomen is flat.     Palpations: Abdomen is soft. There is no hepatomegaly or mass.     Tenderness: There is no abdominal tenderness. There is no rebound.  Genitourinary:    Comments: Declined genital exam. Asymptomatic Lymphadenopathy:     Head:     Left side of head: Submandibular adenopathy present.     Cervical: No cervical adenopathy.     Upper Body:     Right upper body: No supraclavicular or axillary adenopathy.     Left upper body: No supraclavicular or axillary adenopathy.  Skin:    General:  Skin is warm and dry.  Neurological:     Mental Status: He is alert and oriented to person, place, and time.    Assessment and Plan:  Sean Black is a 32 y.o. male presenting to the [facility] Department for STI screening  1. Screening for venereal disease Possible left submandibular adenopathy- pt states this is his normal  - HIV [facility] - Syphilis Serology, [facility] - Chlamydia/GC NAA, Confirmation   Patient does not have STI symptoms Patient accepted all screenings including   Patient meets criteria for HepB screening? No. Ordered? not  applicable Patient meets criteria for HepC screening? No. Ordered? not applicable Recommended condom use with all sex Discussed importance of condom use for STI prevent  Treat gram stain per standing order Discussed time line for [facility] results and that patient will be called with positive results and encouraged patient to call if he had not heard in 2 weeks Recommended repeat testing in 3 months with positive results. Recommended returning for continued or worsening symptoms.   Return if symptoms worsen or fail to improve.  Future Appointments  Date Time Provider [place]  06/23/2022  2:40 PM Venita Lick, NP [facility]  Total time spent 20 minutes  Sharlet Salina, [place]

## 2022-06-13 LAB — CHLAMYDIA/GC NAA, CONFIRMATION
Chlamydia trachomatis, NAA: NEGATIVE
Neisseria gonorrhoeae, NAA: NEGATIVE

## 2022-06-13 LAB — GONOCOCCUS CULTURE

## 2022-06-21 NOTE — Patient Instructions (Incomplete)

## 2022-06-23 ENCOUNTER — Encounter: Payer: Self-pay | Admitting: Nurse Practitioner

## 2022-06-23 DIAGNOSIS — Z0000 Encounter for general adult medical examination without abnormal findings: Secondary | ICD-10-CM

## 2022-06-23 DIAGNOSIS — Z136 Encounter for screening for cardiovascular disorders: Secondary | ICD-10-CM

## 2022-06-23 DIAGNOSIS — R6882 Decreased libido: Secondary | ICD-10-CM

## 2022-07-14 NOTE — Addendum Note (Signed)
Addended by: Cletis Media on: 07/14/2022 08:28 AM   Modules accepted: Orders

## 2022-08-01 DIAGNOSIS — E7800 Pure hypercholesterolemia, unspecified: Secondary | ICD-10-CM | POA: Insufficient documentation

## 2022-08-01 NOTE — Patient Instructions (Signed)

## 2022-08-03 ENCOUNTER — Encounter: Payer: Self-pay | Admitting: Nurse Practitioner

## 2022-08-03 ENCOUNTER — Ambulatory Visit (INDEPENDENT_AMBULATORY_CARE_PROVIDER_SITE_OTHER): Payer: BC Managed Care – PPO | Admitting: Nurse Practitioner

## 2022-08-03 VITALS — BP 130/70 | HR 80 | Temp 98.6°F | Ht 67.99 in | Wt 198.1 lb

## 2022-08-03 DIAGNOSIS — Z833 Family history of diabetes mellitus: Secondary | ICD-10-CM | POA: Diagnosis not present

## 2022-08-03 DIAGNOSIS — Z0000 Encounter for general adult medical examination without abnormal findings: Secondary | ICD-10-CM | POA: Diagnosis not present

## 2022-08-03 DIAGNOSIS — R6882 Decreased libido: Secondary | ICD-10-CM | POA: Diagnosis not present

## 2022-08-03 DIAGNOSIS — E7800 Pure hypercholesterolemia, unspecified: Secondary | ICD-10-CM

## 2022-08-03 NOTE — Addendum Note (Signed)
Addended by: Marnee Guarneri T on: 08/03/2022 01:40 PM   Modules accepted: Orders

## 2022-08-03 NOTE — Addendum Note (Signed)
Addended by: Marnee Guarneri T on: 08/03/2022 01:39 PM   Modules accepted: Orders

## 2022-08-03 NOTE — Assessment & Plan Note (Signed)
Ongoing issue, using OTC products -- check Testosterone and FSH/LH today per request.

## 2022-08-03 NOTE — Assessment & Plan Note (Signed)
Noted on past labs. Continue diet focus and exercise at home.  Labs today.

## 2022-08-03 NOTE — Assessment & Plan Note (Signed)
Significant family history of diabetes -- check A1c today and monitor closely due to family history.

## 2022-08-03 NOTE — Progress Notes (Addendum)
BP 130/70   Pulse 80   Temp 98.6 F (37 C) (Oral)   Ht 5' 7.99" (1.727 m)   Wt 198 lb 1.6 oz (89.9 kg)   SpO2 98%   BMI 30.13 kg/m    Subjective:    Patient ID: Sean Black, male    DOB: 12-13-1990, 32 y.o.   MRN: [id number]  HPI: Sean Black is a 32 y.o. male presenting on 08/03/2022 for comprehensive medical examination. Current medical complaints include:none  He currently lives with: self Interim Problems from his last visit: no   Family history of diabetes in both mother and father, mother is well-controlled and has been able to stop medication.  He is unsure if father takes medications.  Maternal grandmother also with diabetes.  He would like checked today -- as is fatigued and craves sugar a lot.  He would like testosterone level checked -- due to increased fatigue + decreased libido -- is trying OTC products.    FALL RISK:    08/03/2022    1:21 PM 07/12/2019   10:16 AM 08/25/2016    3:15 PM  Fall Risk   Falls in the past year? 1 0 No  Number falls in past yr: 0 0   Injury with Fall? 0 0   Risk for fall due to : History of fall(s)    Follow up  Falls evaluation completed     Depression Screen    08/03/2022    1:21 PM 07/12/2020   10:03 AM 02/13/2020    2:40 PM 07/12/2019   10:16 AM 08/25/2016    3:15 PM  Depression screen PHQ 2/9  Decreased Interest 1 0 3 0 0  Down, Depressed, Hopeless 1 0 2 0 0  PHQ - 2 Score 2 0 5 0 0  Altered sleeping 1 1 2     Tired, decreased energy 2 1 2     Change in appetite 2 1 2     Feeling bad or failure about yourself  1 0 1    Trouble concentrating 1 0 1    Moving slowly or fidgety/restless 0 0 1    Suicidal thoughts 0 0 0    PHQ-9 Score 9 3 14     Difficult doing work/chores Not difficult at all  Somewhat difficult        08/03/2022    1:21 PM  GAD 7 : Generalized Anxiety Score  Nervous, Anxious, on Edge 1  Control/stop worrying 1  Worry too much - different things 1  Trouble relaxing 1  Restless 1   Easily annoyed or irritable 1  Afraid - awful might happen 1  Total GAD 7 Score 7  Anxiety Difficulty Not difficult at all   Advanced Directives <no information>  Past Medical History:  Past Medical History:  Diagnosis Date   HSV-2 (herpes simplex virus 2) infection     Surgical History:  Past Surgical History:  Procedure Laterality Date   wisdom tooth  removal Bilateral     Medications:  No current outpatient medications on file prior to visit.   No current facility-administered medications on file prior to visit.    Allergies:  No Known Allergies  Social History:  Social History   Socioeconomic History   Marital status: Single    Spouse name: Not on file   Number of children: Not on file   Years of education: Not on file   Highest education level: Not on file  Occupational History   Not on  file  Tobacco Use   Smoking status: Never   Smokeless tobacco: Never  Vaping Use   Vaping Use: Never used  Substance and Sexual Activity   Alcohol use: Yes    Comment: occasional   Drug use: No   Sexual activity: Not on file  Other Topics Concern   Not on file  Social History Narrative   Not on file   Social Determinants of Health   Financial Resource Strain: Not on file  Food Insecurity: Not on file  Transportation Needs: Not on file  Physical Activity: Not on file  Stress: Not on file  Social Connections: Not on file  Intimate Partner Violence: Not on file   Social History   Tobacco Use  Smoking Status Never  Smokeless Tobacco Never   Social History   Substance and Sexual Activity  Alcohol Use Yes   Comment: occasional    Family History:  Family History  Problem Relation Age of Onset   Diabetes Father    Diabetes Maternal Grandmother     Past medical history, surgical history, medications, allergies, family history and social history reviewed with patient today and changes made to appropriate areas of the chart.   Review of Systems -  negative All other ROS negative except what is listed above and in the HPI.      Objective:    BP 130/70   Pulse 80   Temp 98.6 F (37 C) (Oral)   Ht 5' 7.99" (1.727 m)   Wt 198 lb 1.6 oz (89.9 kg)   SpO2 98%   BMI 30.13 kg/m   Wt Readings from Last 3 Encounters:  08/03/22 198 lb 1.6 oz (89.9 kg)  07/12/20 197 lb 3.2 oz (89.4 kg)  03/04/20 187 lb 6.4 oz (85 kg)    Physical Exam Vitals and nursing note reviewed.  Constitutional:      General: He is awake. He is not in acute distress.    Appearance: He is well-developed and well-groomed. He is not ill-appearing.  HENT:     Head: Normocephalic and atraumatic.     Right Ear: Hearing, tympanic membrane, ear canal and external ear normal. No drainage.     Left Ear: Hearing, tympanic membrane, ear canal and external ear normal. No drainage.     Nose: Nose normal.     Mouth/Throat:     Pharynx: Uvula midline.  Eyes:     General: Lids are normal.        Right eye: No discharge.        Left eye: No discharge.     Extraocular Movements: Extraocular movements intact.     Conjunctiva/sclera: Conjunctivae normal.     Pupils: Pupils are equal, round, and reactive to light.     Visual Fields: Right eye visual fields normal and left eye visual fields normal.  Neck:     Thyroid: No thyromegaly.     Vascular: No carotid bruit or JVD.     Trachea: Trachea normal.  Cardiovascular:     Rate and Rhythm: Normal rate and regular rhythm.     Heart sounds: Normal heart sounds, S1 normal and S2 normal. No murmur heard.    No gallop.  Pulmonary:     Effort: Pulmonary effort is normal. No accessory muscle usage or respiratory distress.     Breath sounds: Normal breath sounds.  Abdominal:     General: Bowel sounds are normal.     Palpations: Abdomen is soft. There is no hepatomegaly or  splenomegaly.     Tenderness: There is no abdominal tenderness.  Musculoskeletal:        General: Normal range of motion.     Cervical back: Normal range of  motion and neck supple.     Right lower leg: No edema.     Left lower leg: No edema.  Lymphadenopathy:     Head:     Right side of head: No submental, submandibular, tonsillar, preauricular or posterior auricular adenopathy.     Left side of head: No submental, submandibular, tonsillar, preauricular or posterior auricular adenopathy.     Cervical: No cervical adenopathy.  Skin:    General: Skin is warm and dry.     Capillary Refill: Capillary refill takes less than 2 seconds.     Findings: No rash.  Neurological:     Mental Status: He is alert and oriented to person, place, and time.     Gait: Gait is intact.     Deep Tendon Reflexes: Reflexes are normal and symmetric.     Reflex Scores:      Brachioradialis reflexes are 2+ on the right side and 2+ on the left side.      Patellar reflexes are 2+ on the right side and 2+ on the left side. Psychiatric:        Attention and Perception: Attention normal.        Mood and Affect: Mood normal.        Speech: Speech normal.        Behavior: Behavior normal. Behavior is cooperative.        Thought Content: Thought content normal.        Cognition and Memory: Cognition normal.        Judgment: Judgment normal.    Results for orders placed or performed in visit on 06/15/22  HM HIV SCREENING LAB  Result Value Ref Range   HM HIV Screening Negative - Validated       Assessment & Plan:   Problem List Items Addressed This Visit       Other   Decreased libido    Ongoing issue, using OTC products -- check Testosterone and FSH/LH today per request.      Relevant Orders   Testosterone, free, total([facility])   FSH/LH   Elevated low density lipoprotein (LDL) cholesterol level - Primary    Noted on past labs. Continue diet focus and exercise at home.  Labs today.      Relevant Orders   Comprehensive metabolic panel   Lipid Panel w/o Chol/HDL Ratio   Family history of diabetes mellitus in father    Significant family history  of diabetes -- check A1c today and monitor closely due to family history.      Relevant Orders   HgB A1c   Other Visit Diagnoses     Encounter for annual physical exam       Annual physical today with labs and health maintenance reviewed, discussed with patient.   Relevant Orders   CBC with Differential/Platelet   TSH       Discussed aspirin prophylaxis for myocardial infarction prevention and decision was it was not indicated  LABORATORY TESTING:  Health maintenance labs ordered today as discussed above.   IMMUNIZATIONS:   - Tdap: Tetanus vaccination status reviewed: last tetanus booster within 10 years. - Influenza: Refused -- gets sick with this - Pneumovax: Not applicable - Prevnar: Not applicable - Zostavax vaccine: Not applicable - Covid: had x 1 vaccine  SCREENING: -  Colonoscopy: Not applicable  Discussed with patient purpose of the colonoscopy is to detect colon cancer at curable precancerous or early stages   - AAA Screening: Not applicable  -Hearing Test: Not applicable  -Spirometry: Not applicable   PATIENT COUNSELING:    Sexuality: Discussed sexually transmitted diseases, partner selection, use of condoms, avoidance of unintended pregnancy  and contraceptive alternatives.   Advised to avoid cigarette smoking.  I discussed with the patient that most people either abstain from alcohol or drink within safe limits (<=14/week and <=4 drinks/occasion for males, <=7/weeks and <= 3 drinks/occasion for females) and that the risk for alcohol disorders and other health effects rises proportionally with the number of drinks per week and how often a drinker exceeds daily limits.  Discussed cessation/primary prevention of drug use and availability of treatment for abuse.   Diet: Encouraged to adjust caloric intake to maintain  or achieve ideal body weight, to reduce intake of dietary saturated fat and total fat, to limit sodium intake by avoiding high sodium foods and not  adding table salt, and to maintain adequate dietary potassium and calcium preferably from fresh fruits, vegetables, and low-fat dairy products.    Stressed the importance of regular exercise  Injury prevention: Discussed safety belts, safety helmets, smoke detector, smoking near bedding or upholstery.   Dental health: Discussed importance of regular tooth brushing, flossing, and dental visits.   Follow up plan: NEXT PREVENTATIVE PHYSICAL DUE IN 1 YEAR. Return in about 1 year (around 08/03/2023) for Annual physical + outpatient lab first thing in morning visit .

## 2022-08-04 ENCOUNTER — Other Ambulatory Visit: Payer: BC Managed Care – PPO

## 2022-08-04 DIAGNOSIS — Z0000 Encounter for general adult medical examination without abnormal findings: Secondary | ICD-10-CM | POA: Diagnosis not present

## 2022-08-04 DIAGNOSIS — E7800 Pure hypercholesterolemia, unspecified: Secondary | ICD-10-CM | POA: Diagnosis not present

## 2022-08-04 DIAGNOSIS — Z833 Family history of diabetes mellitus: Secondary | ICD-10-CM

## 2022-08-04 DIAGNOSIS — R6882 Decreased libido: Secondary | ICD-10-CM

## 2022-08-04 DIAGNOSIS — Z136 Encounter for screening for cardiovascular disorders: Secondary | ICD-10-CM | POA: Diagnosis not present

## 2022-08-05 ENCOUNTER — Ambulatory Visit: Payer: Self-pay | Admitting: *Deleted

## 2022-08-05 ENCOUNTER — Other Ambulatory Visit: Payer: Self-pay | Admitting: Nurse Practitioner

## 2022-08-05 DIAGNOSIS — R7989 Other specified abnormal findings of blood chemistry: Secondary | ICD-10-CM

## 2022-08-05 LAB — LIPID PANEL W/O CHOL/HDL RATIO
Cholesterol, Total: 195 mg/dL (ref 100–199)
HDL: 33 mg/dL — ABNORMAL LOW (ref >39)
LDL Chol Calc (NIH): 139 mg/dL — ABNORMAL HIGH (ref 0–99)
Triglycerides: 124 mg/dL (ref 0–149)
VLDL Cholesterol Cal: 23 mg/dL (ref 5–40)

## 2022-08-05 LAB — CBC WITH DIFFERENTIAL/PLATELET
Basophils Absolute: 0 10*3/uL (ref 0.0–0.2)
Basos: 1 %
EOS (ABSOLUTE): 0.2 10*3/uL (ref 0.0–0.4)
Eos: 2 %
Hematocrit: 48.1 % (ref 37.5–51.0)
Hemoglobin: 15.6 g/dL (ref 13.0–17.7)
Immature Grans (Abs): 0.1 10*3/uL (ref 0.0–0.1)
Immature Granulocytes: 1 %
Lymphocytes Absolute: 3.2 10*3/uL — ABNORMAL HIGH (ref 0.7–3.1)
Lymphs: 41 %
MCH: 30.1 pg (ref 26.6–33.0)
MCHC: 32.4 g/dL (ref 31.5–35.7)
MCV: 93 fL (ref 79–97)
Monocytes Absolute: 0.7 10*3/uL (ref 0.1–0.9)
Monocytes: 9 %
Neutrophils Absolute: 3.8 10*3/uL (ref 1.4–7.0)
Neutrophils: 46 %
Platelets: 272 10*3/uL (ref 150–450)
RBC: 5.18 x10E6/uL (ref 4.14–5.80)
RDW: 15 % (ref 11.6–15.4)
WBC: 7.9 10*3/uL (ref 3.4–10.8)

## 2022-08-05 LAB — TSH: TSH: 5.32 u[IU]/mL — ABNORMAL HIGH (ref 0.450–4.500)

## 2022-08-05 LAB — FSH/LH
FSH: <0.3 m[IU]/mL — ABNORMAL LOW (ref 1.5–12.4)
LH: <0.3 m[IU]/mL — ABNORMAL LOW (ref 1.7–8.6)

## 2022-08-05 LAB — COMPREHENSIVE METABOLIC PANEL
ALT: 47 IU/L — ABNORMAL HIGH (ref 0–44)
AST: 28 IU/L (ref 0–40)
Albumin/Globulin Ratio: 1.7 (ref 1.2–2.2)
Albumin: 4.6 g/dL (ref 4.1–5.1)
Alkaline Phosphatase: 52 IU/L (ref 44–121)
BUN/Creatinine Ratio: 13 (ref 9–20)
BUN: 12 mg/dL (ref 6–20)
Bilirubin Total: 0.3 mg/dL (ref 0.0–1.2)
CO2: 19 mmol/L — ABNORMAL LOW (ref 20–29)
Calcium: 9 mg/dL (ref 8.7–10.2)
Chloride: 105 mmol/L (ref 96–106)
Creatinine, Ser: 0.94 mg/dL (ref 0.76–1.27)
Globulin, Total: 2.7 g/dL (ref 1.5–4.5)
Glucose: 103 mg/dL — ABNORMAL HIGH (ref 70–99)
Potassium: 4.1 mmol/L (ref 3.5–5.2)
Sodium: 140 mmol/L (ref 134–144)
Total Protein: 7.3 g/dL (ref 6.0–8.5)
eGFR: 110 mL/min/{1.73_m2} (ref >59)

## 2022-08-05 LAB — TESTOSTERONE, FREE, TOTAL, SHBG
Sex Hormone Binding: 5.8 nmol/L — ABNORMAL LOW (ref 16.5–55.9)
Testosterone, Free: 48.6 pg/mL — ABNORMAL HIGH (ref 8.7–25.1)
Testosterone: 1349 ng/dL — ABNORMAL HIGH (ref 264–916)

## 2022-08-05 LAB — HEMOGLOBIN A1C
Est. average glucose Bld gHb Est-mCnc: 97 mg/dL
Hgb A1c MFr Bld: 5 % (ref 4.8–5.6)

## 2022-08-05 NOTE — Telephone Encounter (Signed)
  Chief Complaint: He returned call and was given his lab result message Symptoms:  Frequency:  Pertinent Negatives: Patient denies  Disposition: [] ED /[] Urgent Care (no appt availability in office) / [] Appointment(In office/virtual)/ []  [facility]/ [] Home Care/ [] Refused Recommended Disposition /[] [facility]/ [x]  Follow-up with PCP Additional Notes: Labs scheduled for 4 wks.    Question sent to [facility] regarding the [facility] and LH.   Can answer via MyChart.

## 2022-08-05 NOTE — Telephone Encounter (Signed)
Pt called in and was given the lab result message from Marnee Guarneri, NP dated 08/05/2022 at 9:20 AM.  Lab recheck scheduled for Sep 04, 2022 at 8:20.  He had a question about the [facility] and [facility] message. FSH and LH are low, where they should be.    Wasn't sure what Jolene meant with this statement.  He is taking a testosterone supplement so he is going to stop taking it.    You can answer his question about the [facility] and LH via his MyChart. Reason for Disposition  [1] Follow-up call to recent contact AND [2] information only call, no triage required  Answer Assessment - Initial Assessment Questions 1. REASON FOR CALL or QUESTION: "What is your reason for calling today?" or "How can I best help you?" or "What question do you have that I can help answer?"     Called in and was given his lab result message  Protocols used: Information Only Call - No Triage-A-AH

## 2022-08-05 NOTE — Progress Notes (Signed)
Pt was scheduled for 09/04/2022 @ 8:20 am.

## 2022-08-05 NOTE — Progress Notes (Signed)
Contacted via [place] -- need lab only visit in 4 weeks please. Good morning Sean Black, your labs have returned: - CBC shows no anemia or infection. - Kidney function, creatinine and eGFR, remains normal.  Liver function shows mild elevation in ALT, but this is only mild and we can continue to monitor.   - Cholesterol labs continue to show elevation in LDL -- no medications needed at this time, continue diet focus. - A1c show no diabetes or prediabetes. - Thyroid lab, TSH, is elevated -- meaning thyroid is more slow and sluggish, I would like to recheck this on outpatient labs in 4 weeks please.  My staff will call to schedule.  If ongoing elevations we may need to start medication for hypothyroid. - FSH and LH are low, where they should be.  Testosterone is elevated, if you are taking a supplement I recommend cutting back on this some as level is too high.  I will recheck this in 4 weeks too.  Any questions? Keep being amazing!!  Thank you for allowing me to participate in your care.  I appreciate you. Kindest regards, Victorine Mcnee

## 2022-09-04 ENCOUNTER — Other Ambulatory Visit: Payer: BC Managed Care – PPO

## 2023-02-13 DIAGNOSIS — Z202 Contact with and (suspected) exposure to infections with a predominantly sexual mode of transmission: Secondary | ICD-10-CM | POA: Diagnosis not present

## 2023-02-13 DIAGNOSIS — Z7251 High risk heterosexual behavior: Secondary | ICD-10-CM | POA: Diagnosis not present

## 2023-05-12 ENCOUNTER — Ambulatory Visit: Payer: BC Managed Care – PPO | Admitting: Nurse Practitioner

## 2023-05-24 NOTE — Patient Instructions (Signed)

## 2023-05-28 ENCOUNTER — Ambulatory Visit (INDEPENDENT_AMBULATORY_CARE_PROVIDER_SITE_OTHER): Payer: BC Managed Care – PPO | Admitting: Nurse Practitioner

## 2023-05-28 VITALS — BP 102/67 | HR 80 | Temp 97.9°F | Ht 68.0 in | Wt 217.8 lb

## 2023-05-28 DIAGNOSIS — R5383 Other fatigue: Secondary | ICD-10-CM | POA: Insufficient documentation

## 2023-05-28 DIAGNOSIS — R7989 Other specified abnormal findings of blood chemistry: Secondary | ICD-10-CM | POA: Diagnosis not present

## 2023-05-28 DIAGNOSIS — Z833 Family history of diabetes mellitus: Secondary | ICD-10-CM | POA: Diagnosis not present

## 2023-05-28 NOTE — Assessment & Plan Note (Signed)
Noted on labs April 2024, missed recheck.  Has significant symptoms today.  Recheck TSH + check Free T4 and Thyroid antibody. Discussed with him hypothyroidism and treatment.

## 2023-05-28 NOTE — Assessment & Plan Note (Signed)
Ongoing issue, suspect related to thyroid or possible diabetes.  Labs today: A1c, thyroid labs, CBC, CMP, iron, ferritin.  Discussed plan with patient and determine next steps after all labs return.

## 2023-05-28 NOTE — Assessment & Plan Note (Addendum)
Significant family history of diabetes -- check A1c today and monitor closely due to family history.  Having some symptoms today.

## 2023-05-28 NOTE — Assessment & Plan Note (Signed)
Noted on past labs, elevated level but was taking testosterone at time.  Recheck outpatient in early morning.

## 2023-05-28 NOTE — Progress Notes (Signed)
BP 102/67   Pulse 80   Temp 97.9 F (36.6 C) (Oral)   Ht 5\' 8"  (1.727 m)   Wt 217 lb 12.8 oz (98.8 kg)   SpO2 99%   BMI 33.12 kg/m    Subjective:    Patient ID: Sean Black, male    DOB: 1990/08/19, 33 y.o.   MRN: [id number]  HPI: Sean Black is a 33 y.o. male  Chief Complaint  Patient presents with   Fatigue    Patient states he has been feeling very fatigued for the last 3 to 4 months. States he has also been gaining weight and is concerned about having diabetes as it runs in his family.    Testosterone    Patient states he feels like his testosterone has been low.    ELEVATED TSH AND FATIGUE Presents today for fatigue and concerns for diabetes and low testosterone.  At last visit in April 2024 his TSH was elevated and testosterone was high.  He was to return for repeat labs, but missed these. Over past 3 months has been feeling more tired and depressed.  Has had no stressors recently to affect mood.  He was taking testosterone at last visit when level was high, stopped taking testosterone in August or September.  Has family history of diabetes, maternal grandmother and father + mother had diabetes but this got better. Fatigue: yes Cold intolerance: yes Heat intolerance: no Weight gain: yes Weight loss: no Constipation: yes Diarrhea/loose stools: no Palpitations: no Lower extremity edema: no Anxiety/depressed mood: yes      05/28/2023    4:02 PM 08/03/2022    1:21 PM 07/12/2020   10:03 AM 02/13/2020    2:40 PM 07/12/2019   10:16 AM  Depression screen PHQ 2/9  Decreased Interest 3 1 0 3 0  Down, Depressed, Hopeless 2 1 0 2 0  PHQ - 2 Score 5 2 0 5 0  Altered sleeping 3 1 1 2    Tired, decreased energy 3 2 1 2    Change in appetite 3 2 1 2    Feeling bad or failure about yourself  2 1 0 1   Trouble concentrating 2 1 0 1   Moving slowly or fidgety/restless 0 0 0 1   Suicidal thoughts 0 0 0 0   PHQ-9 Score 18 9 3 14    Difficult doing work/chores   Not difficult at all  Somewhat difficult        05/28/2023    4:03 PM 08/03/2022    1:21 PM  GAD 7 : Generalized Anxiety Score  Nervous, Anxious, on Edge 1 1  Control/stop worrying 1 1  Worry too much - different things 1 1  Trouble relaxing 2 1  Restless 1 1  Easily annoyed or irritable 2 1  Afraid - awful might happen 1 1  Total GAD 7 Score 9 7  Anxiety Difficulty Somewhat difficult Not difficult at all   Relevant past medical, surgical, family and social history reviewed and updated as indicated. Interim medical history since our last visit reviewed. Allergies and medications reviewed and updated.  Review of Systems  Constitutional:  Positive for appetite change (more hungry), fatigue and unexpected weight change. Negative for activity change, chills and fever.  Respiratory:  Negative for cough, chest tightness, shortness of breath and wheezing.   Cardiovascular:  Negative for chest pain, palpitations and leg swelling.  Gastrointestinal:  Positive for constipation. Negative for abdominal pain, diarrhea, nausea and vomiting.  Endocrine:  Positive for cold intolerance, polydipsia, polyphagia and polyuria. Negative for heat intolerance.  Neurological: Negative.   Psychiatric/Behavioral:  Positive for sleep disturbance. Negative for decreased concentration, self-injury and suicidal ideas. The patient is nervous/anxious.    Per HPI unless specifically indicated above     Objective:    BP 102/67   Pulse 80   Temp 97.9 F (36.6 C) (Oral)   Ht 5\' 8"  (1.727 m)   Wt 217 lb 12.8 oz (98.8 kg)   SpO2 99%   BMI 33.12 kg/m   Wt Readings from Last 3 Encounters:  05/28/23 217 lb 12.8 oz (98.8 kg)  08/03/22 198 lb 1.6 oz (89.9 kg)  07/12/20 197 lb 3.2 oz (89.4 kg)    Physical Exam Vitals and nursing note reviewed.  Constitutional:      General: He is awake. He is not in acute distress.    Appearance: He is well-developed and well-groomed. He is obese. He is not ill-appearing or  toxic-appearing.  HENT:     Head: Normocephalic.     Right Ear: Hearing and external ear normal.     Left Ear: Hearing and external ear normal.  Eyes:     General: Lids are normal.     Extraocular Movements: Extraocular movements intact.     Conjunctiva/sclera: Conjunctivae normal.  Neck:     Thyroid: No thyromegaly.     Vascular: No carotid bruit.  Cardiovascular:     Rate and Rhythm: Normal rate and regular rhythm.     Heart sounds: Normal heart sounds. No murmur heard.    No gallop.  Pulmonary:     Effort: No accessory muscle usage or respiratory distress.     Breath sounds: Normal breath sounds.  Abdominal:     General: Bowel sounds are normal. There is no distension.     Palpations: Abdomen is soft.     Tenderness: There is no abdominal tenderness.  Musculoskeletal:     Cervical back: Full passive range of motion without pain.     Right lower leg: No edema.     Left lower leg: No edema.  Lymphadenopathy:     Cervical: No cervical adenopathy.  Skin:    General: Skin is warm.     Capillary Refill: Capillary refill takes less than 2 seconds.  Neurological:     Mental Status: He is alert and oriented to person, place, and time.     Deep Tendon Reflexes: Reflexes are normal and symmetric.     Reflex Scores:      Brachioradialis reflexes are 2+ on the right side and 2+ on the left side.      Patellar reflexes are 2+ on the right side and 2+ on the left side. Psychiatric:        Attention and Perception: Attention normal.        Mood and Affect: Mood normal.        Speech: Speech normal.        Behavior: Behavior normal. Behavior is cooperative.        Thought Content: Thought content normal.     Results for orders placed or performed in visit on 08/04/22  CBC with Differential/Platelet   Collection Time: 08/04/22  8:12 AM  Result Value Ref Range   WBC 7.9 3.4 - 10.8 x10E3/uL   RBC 5.18 4.14 - 5.80 x10E6/uL   Hemoglobin 15.6 13.0 - 17.7 g/dL   Hematocrit 16.1 09.6  - 51.0 %   MCV 93 79 - 97 fL  MCH 30.1 26.6 - 33.0 pg   MCHC 32.4 31.5 - 35.7 g/dL   RDW 16.1 09.6 - 04.5 %   Platelets 272 150 - 450 x10E3/uL   Neutrophils 46 Not Estab. %   Lymphs 41 Not Estab. %   Monocytes 9 Not Estab. %   Eos 2 Not Estab. %   Basos 1 Not Estab. %   Neutrophils Absolute 3.8 1.4 - 7.0 x10E3/uL   Lymphocytes Absolute 3.2 (H) 0.7 - 3.1 x10E3/uL   Monocytes Absolute 0.7 0.1 - 0.9 x10E3/uL   EOS (ABSOLUTE) 0.2 0.0 - 0.4 x10E3/uL   Basophils Absolute 0.0 0.0 - 0.2 x10E3/uL   Immature Granulocytes 1 Not Estab. %   Immature Grans (Abs) 0.1 0.0 - 0.1 x10E3/uL  Comprehensive metabolic panel   Collection Time: 08/04/22  8:12 AM  Result Value Ref Range   Glucose 103 (H) 70 - 99 mg/dL   BUN 12 6 - 20 mg/dL   Creatinine, Ser 4.09 0.76 - 1.27 mg/dL   eGFR 811 >91 YN/WGN/5.62   BUN/Creatinine Ratio 13 9 - 20   Sodium 140 134 - 144 mmol/L   Potassium 4.1 3.5 - 5.2 mmol/L   Chloride 105 96 - 106 mmol/L   CO2 19 (L) 20 - 29 mmol/L   Calcium 9.0 8.7 - 10.2 mg/dL   Total Protein 7.3 6.0 - 8.5 g/dL   Albumin 4.6 4.1 - 5.1 g/dL   Globulin, Total 2.7 1.5 - 4.5 g/dL   Albumin/Globulin Ratio 1.7 1.2 - 2.2   Bilirubin Total 0.3 0.0 - 1.2 mg/dL   Alkaline Phosphatase 52 44 - 121 IU/L   AST 28 0 - 40 IU/L   ALT 47 (H) 0 - 44 IU/L  HgB A1c   Collection Time: 08/04/22  8:12 AM  Result Value Ref Range   Hgb A1c MFr Bld 5.0 4.8 - 5.6 %   Est. average glucose Bld gHb Est-mCnc 97 mg/dL  Lipid Panel w/o Chol/HDL Ratio   Collection Time: 08/04/22  8:12 AM  Result Value Ref Range   Cholesterol, Total 195 100 - 199 mg/dL   Triglycerides 130 0 - 149 mg/dL   HDL 33 (L) >86 mg/dL   VLDL Cholesterol Cal 23 5 - 40 mg/dL   LDL Chol Calc (NIH) 578 (H) 0 - 99 mg/dL  TSH   Collection Time: 08/04/22  8:12 AM  Result Value Ref Range   TSH 5.320 (H) 0.450 - 4.500 uIU/mL  FSH/LH   Collection Time: 08/04/22  8:12 AM  Result Value Ref Range   LH <0.3 (L) 1.7 - 8.6 mIU/mL   FSH <0.3 (L)  1.5 - 12.4 mIU/mL  Testosterone, free, total([facility])   Collection Time: 08/04/22  8:12 AM  Result Value Ref Range   Testosterone 1,349 (H) 264 - 916 ng/dL   Testosterone, Free 46.9 (H) 8.7 - 25.1 pg/mL   Sex Hormone Binding 5.8 (L) 16.5 - 55.9 nmol/L      Assessment & Plan:   Problem List Items Addressed This Visit       Other   Abnormal serum testosterone level   Noted on past labs, elevated level but was taking testosterone at time.  Recheck outpatient in early morning.      Relevant Orders   Testosterone, free, total([facility])   Abnormal TSH   Noted on labs April 2024, missed recheck.  Has significant symptoms today.  Recheck TSH + check Free T4 and Thyroid antibody. Discussed with him hypothyroidism and treatment.  Relevant Orders   T4, free   Thyroid peroxidase antibody   TSH   Family history of diabetes mellitus   Significant family history of diabetes -- check A1c today and monitor closely due to family history.  Having some symptoms today.      Relevant Orders   HgB A1c   Fatigue - Primary   Ongoing issue, suspect related to thyroid or possible diabetes.  Labs today: A1c, thyroid labs, CBC, CMP, iron, ferritin.  Discussed plan with patient and determine next steps after all labs return.      Relevant Orders   CBC with Differential/Platelet   Comprehensive metabolic panel   Ferritin   Iron     Follow up plan: Return if symptoms worsen or fail to improve.

## 2023-05-29 ENCOUNTER — Encounter: Payer: Self-pay | Admitting: Nurse Practitioner

## 2023-05-29 ENCOUNTER — Other Ambulatory Visit: Payer: Self-pay | Admitting: Nurse Practitioner

## 2023-05-29 DIAGNOSIS — R7989 Other specified abnormal findings of blood chemistry: Secondary | ICD-10-CM

## 2023-05-29 DIAGNOSIS — R7303 Prediabetes: Secondary | ICD-10-CM | POA: Insufficient documentation

## 2023-05-29 LAB — CBC WITH DIFFERENTIAL/PLATELET
Basophils Absolute: 0 10*3/uL (ref 0.0–0.2)
Basos: 0 %
EOS (ABSOLUTE): 0.1 10*3/uL (ref 0.0–0.4)
Eos: 1 %
Hematocrit: 45.3 % (ref 37.5–51.0)
Hemoglobin: 15.2 g/dL (ref 13.0–17.7)
Immature Grans (Abs): 0 10*3/uL (ref 0.0–0.1)
Immature Granulocytes: 0 %
Lymphocytes Absolute: 2 10*3/uL (ref 0.7–3.1)
Lymphs: 25 %
MCH: 30.6 pg (ref 26.6–33.0)
MCHC: 33.6 g/dL (ref 31.5–35.7)
MCV: 91 fL (ref 79–97)
Monocytes Absolute: 0.6 10*3/uL (ref 0.1–0.9)
Monocytes: 7 %
Neutrophils Absolute: 5.2 10*3/uL (ref 1.4–7.0)
Neutrophils: 67 %
Platelets: 236 10*3/uL (ref 150–450)
RBC: 4.97 x10E6/uL (ref 4.14–5.80)
RDW: 13 % (ref 11.6–15.4)
WBC: 7.9 10*3/uL (ref 3.4–10.8)

## 2023-05-29 LAB — COMPREHENSIVE METABOLIC PANEL
ALT: 39 [IU]/L (ref 0–44)
AST: 32 [IU]/L (ref 0–40)
Albumin: 4.8 g/dL (ref 4.1–5.1)
Alkaline Phosphatase: 73 [IU]/L (ref 44–121)
BUN/Creatinine Ratio: 20 (ref 9–20)
BUN: 17 mg/dL (ref 6–20)
Bilirubin Total: <0.2 mg/dL (ref 0.0–1.2)
CO2: 22 mmol/L (ref 20–29)
Calcium: 9.9 mg/dL (ref 8.7–10.2)
Chloride: 100 mmol/L (ref 96–106)
Creatinine, Ser: 0.85 mg/dL (ref 0.76–1.27)
Globulin, Total: 2.9 g/dL (ref 1.5–4.5)
Glucose: 89 mg/dL (ref 70–99)
Potassium: 3.6 mmol/L (ref 3.5–5.2)
Sodium: 140 mmol/L (ref 134–144)
Total Protein: 7.7 g/dL (ref 6.0–8.5)
eGFR: 118 mL/min/{1.73_m2} (ref >59)

## 2023-05-29 LAB — HEMOGLOBIN A1C
Est. average glucose Bld gHb Est-mCnc: 120 mg/dL
Hgb A1c MFr Bld: 5.8 % — ABNORMAL HIGH (ref 4.8–5.6)

## 2023-05-29 LAB — THYROID PEROXIDASE ANTIBODY: Thyroperoxidase Ab SerPl-aCnc: 11 [IU]/mL (ref 0–34)

## 2023-05-29 LAB — FERRITIN: Ferritin: 499 ng/mL — ABNORMAL HIGH (ref 30–400)

## 2023-05-29 LAB — TSH: TSH: 3.5 u[IU]/mL (ref 0.450–4.500)

## 2023-05-29 LAB — IRON: Iron: 68 ug/dL (ref 38–169)

## 2023-05-29 LAB — T4, FREE: Free T4: 1.26 ng/dL (ref 0.82–1.77)

## 2023-05-29 NOTE — Progress Notes (Signed)
Contacted via MyChart  Good morning Odell, your labs have returned and they surprised me: - Thyroid levels, including Free T4/TSH/thyroid antibody, are normal ranges. - Kidney function, creatinine and eGFR, remains normal, as is liver function, AST and ALT.  - CBC shows no anemia or infection -- iron level normal. - A1c is 5.8%, which is in prediabetic range of 5.7 to 6.4%.  Anything 6.5% or greater is diabetes.  We will continue to monitor this, but at this  time recommendations are to focus heavy on healthy diet changes and ensure regular exercise.   - Testosterone level we need to check first thing in the morning, please schedule lab only visit and we can check this outpatient.   I do recommend you start a daily multivitamin at this time and we will continue to monitor these levels closely.  Any questions? Keep being stellar!!  Thank you for allowing me to participate in your care.  I appreciate you. Kindest regards, Tomy Khim

## 2023-06-11 ENCOUNTER — Other Ambulatory Visit: Payer: BC Managed Care – PPO

## 2023-06-11 DIAGNOSIS — R7989 Other specified abnormal findings of blood chemistry: Secondary | ICD-10-CM | POA: Diagnosis not present

## 2023-06-13 ENCOUNTER — Encounter: Payer: Self-pay | Admitting: Nurse Practitioner

## 2023-06-13 ENCOUNTER — Other Ambulatory Visit: Payer: Self-pay | Admitting: Nurse Practitioner

## 2023-06-13 DIAGNOSIS — R7989 Other specified abnormal findings of blood chemistry: Secondary | ICD-10-CM

## 2023-06-13 NOTE — Progress Notes (Signed)
 Contacted via MyChart   Good afternoon Sean Black, your labs have returned and testosterone  is definitely much lower than in past when you were taking supplement.  However, it is just above where we would recommend therapy.  I would recommend a recheck of this outpatient in 4 weeks to see where you level sits.  Any questions? Keep being amazing!!  Thank you for allowing me to participate in your care.  I appreciate you. Kindest regards, Marylu Dudenhoeffer

## 2023-06-15 LAB — TESTOSTERONE, FREE, TOTAL, SHBG
Sex Hormone Binding: 21.6 nmol/L (ref 16.5–55.9)
Testosterone, Free: 12.5 pg/mL (ref 8.7–25.1)
Testosterone: 308 ng/dL (ref 264–916)

## 2023-06-15 LAB — THYROID PEROXIDASE ANTIBODY: Thyroperoxidase Ab SerPl-aCnc: 14 [IU]/mL (ref 0–34)

## 2023-06-15 LAB — T4, FREE: Free T4: 1.43 ng/dL (ref 0.82–1.77)

## 2023-06-15 LAB — TSH: TSH: 4.06 u[IU]/mL (ref 0.450–4.500)

## 2023-07-20 ENCOUNTER — Other Ambulatory Visit: Payer: BC Managed Care – PPO

## 2023-07-23 ENCOUNTER — Other Ambulatory Visit

## 2023-07-23 DIAGNOSIS — R7989 Other specified abnormal findings of blood chemistry: Secondary | ICD-10-CM | POA: Diagnosis not present

## 2023-07-24 NOTE — Progress Notes (Signed)
 Good morning, please let Sean Black know his testosterone has returned and is quite low.  I would recommend if he is continuing to feel bad we get him into urology to get their recommendations for this.  Would you like to pursue this?  Let me know.  Any questions? Keep being amazing!!  Thank you for allowing me to participate in your care.  I appreciate you. Kindest regards, Sohil Timko

## 2023-07-26 NOTE — Addendum Note (Signed)
 Addended by: Aura Dials T on: 07/26/2023 02:10 PM   Modules accepted: Orders

## 2023-07-29 LAB — TESTOSTERONE, FREE, TOTAL, SHBG
Sex Hormone Binding: 16.2 nmol/L — ABNORMAL LOW (ref 16.5–55.9)
Testosterone, Free: 7.8 pg/mL — ABNORMAL LOW (ref 8.7–25.1)
Testosterone: 189 ng/dL — ABNORMAL LOW (ref 264–916)

## 2023-08-04 ENCOUNTER — Ambulatory Visit: Payer: BC Managed Care – PPO | Admitting: Nurse Practitioner

## 2023-08-05 ENCOUNTER — Encounter: Payer: Self-pay | Admitting: Nurse Practitioner

## 2023-08-25 ENCOUNTER — Encounter: Admitting: Nurse Practitioner

## 2023-08-30 ENCOUNTER — Encounter: Admitting: Nurse Practitioner

## 2023-09-17 ENCOUNTER — Ambulatory Visit: Admitting: Urology
